# Patient Record
Sex: Male | Born: 1995 | Race: Black or African American | Hispanic: No | Marital: Married | Smoking: Never smoker
Health system: Southern US, Community
[De-identification: ages and names within clinical notes are randomized; demographics above are authoritative.]

---

## 2013-10-24 ENCOUNTER — Encounter (HOSPITAL_COMMUNITY): Payer: Self-pay | Admitting: Emergency Medicine

## 2013-10-24 ENCOUNTER — Emergency Department (HOSPITAL_COMMUNITY)
Admission: EM | Admit: 2013-10-24 | Discharge: 2013-10-24 | Disposition: A | Discharge status: Home / Self Care | Payer: Worker's Compensation | Attending: Emergency Medicine | Admitting: Emergency Medicine

## 2013-10-24 ENCOUNTER — Emergency Department (HOSPITAL_COMMUNITY): Payer: Worker's Compensation

## 2013-10-24 DIAGNOSIS — S61219A Laceration without foreign body of unspecified finger without damage to nail, initial encounter: Secondary | ICD-10-CM

## 2013-10-24 DIAGNOSIS — Y9289 Other specified places as the place of occurrence of the external cause: Secondary | ICD-10-CM | POA: Insufficient documentation

## 2013-10-24 DIAGNOSIS — Y990 Civilian activity done for income or pay: Secondary | ICD-10-CM | POA: Insufficient documentation

## 2013-10-24 DIAGNOSIS — W230XXA Caught, crushed, jammed, or pinched between moving objects, initial encounter: Secondary | ICD-10-CM | POA: Insufficient documentation

## 2013-10-24 DIAGNOSIS — Y9389 Activity, other specified: Secondary | ICD-10-CM | POA: Insufficient documentation

## 2013-10-24 DIAGNOSIS — S61209A Unspecified open wound of unspecified finger without damage to nail, initial encounter: Secondary | ICD-10-CM | POA: Insufficient documentation
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
## 2013-10-24 NOTE — ED Notes (Signed)
Pt reports he was working at [employer] and had crush injury to his distal part of middle finger on right hand. Slight bleeding still. Pt able to bend proximal joint. Radial pulse strong. Pain 2/10.

## 2013-10-25 NOTE — ED Provider Notes (Signed)
Medical screening examination/treatment/procedure(s) were performed by non-physician practitioner and as supervising physician I was immediately available for consultation/collaboration.  Shanna Cisco, MD 10/25/13 [phone]

## 2014-03-08 ENCOUNTER — Emergency Department (HOSPITAL_COMMUNITY)
Admission: EM | Admit: 2014-03-08 | Discharge: 2014-03-09 | Disposition: A | Discharge status: Home / Self Care | Payer: Managed Care, Other (non HMO) | Attending: Emergency Medicine | Admitting: Emergency Medicine

## 2014-03-08 ENCOUNTER — Encounter (HOSPITAL_COMMUNITY): Payer: Self-pay | Admitting: Emergency Medicine

## 2014-03-08 DIAGNOSIS — Y9389 Activity, other specified: Secondary | ICD-10-CM | POA: Insufficient documentation

## 2014-03-08 DIAGNOSIS — S060X0A Concussion without loss of consciousness, initial encounter: Secondary | ICD-10-CM | POA: Insufficient documentation

## 2014-03-08 DIAGNOSIS — Y92410 Unspecified street and highway as the place of occurrence of the external cause: Secondary | ICD-10-CM | POA: Insufficient documentation

## 2014-03-08 DIAGNOSIS — IMO0002 Reserved for concepts with insufficient information to code with codable children: Secondary | ICD-10-CM | POA: Insufficient documentation

## 2014-03-08 DIAGNOSIS — S0990XA Unspecified injury of head, initial encounter: Secondary | ICD-10-CM

## 2014-03-08 DIAGNOSIS — S060X9A Concussion with loss of consciousness of unspecified duration, initial encounter: Secondary | ICD-10-CM

## 2014-03-08 DIAGNOSIS — T07XXXA Unspecified multiple injuries, initial encounter: Secondary | ICD-10-CM

## 2014-03-08 DIAGNOSIS — Z79899 Other long term (current) drug therapy: Secondary | ICD-10-CM | POA: Insufficient documentation

## 2014-03-08 DIAGNOSIS — S060XAA Concussion with loss of consciousness status unknown, initial encounter: Secondary | ICD-10-CM
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
## 2014-03-09 MED ORDER — IBUPROFEN 800 MG PO TABS: 800.0000 mg | ORAL_TABLET | Freq: Three times a day (TID) | ORAL | Status: DC

## 2014-03-09 MED ORDER — METHOCARBAMOL 500 MG PO TABS: 500.0000 mg | ORAL_TABLET | Freq: Two times a day (BID) | ORAL | Status: DC

## 2014-03-09 NOTE — Discharge Instructions (Signed)
Concussion, Adult °A concussion, or closed-head injury, is a brain injury caused by a direct blow to the head or by a quick and sudden movement (jolt) of the head or neck. Concussions are usually not life-threatening. Even so, the effects of a concussion can be serious. If you have had a concussion before, you are more likely to experience concussion-like symptoms after a direct blow to the head.  °CAUSES  °· Direct blow to the head, such as from running into another player during a soccer game, being hit in a fight, or hitting your head on a hard surface. °· A jolt of the head or neck that causes the brain to move back and forth inside the skull, such as in a car crash. °SIGNS AND SYMPTOMS  °The signs of a concussion can be hard to notice. Early on, they may be missed by you, family members, and health care providers. You may look fine but act or feel differently. °Symptoms are usually temporary, but they may last for days, weeks, or even longer. Some symptoms may appear right away while others may not show up for hours or days. Every head injury is different. Symptoms include:  °· Mild to moderate headaches that will not go away. °· A feeling of pressure inside your head.  °· Having more trouble than usual:   °· Learning or remembering things you have heard. °· Answering questions.  °· Paying attention or concentrating.   °· Organizing daily tasks.   °· Making decisions and solving problems.   °· Slowness in thinking, acting or reacting, speaking, or reading.   °· Getting lost or being easily confused.   °· Feeling tired all the time or lacking energy (fatigued).   °· Feeling drowsy.   °· Sleep disturbances.   °· Sleeping more than usual.   °· Sleeping less than usual.   °· Trouble falling asleep.   °· Trouble sleeping (insomnia).   °· Loss of balance or feeling lightheaded or dizzy.   °· Nausea or vomiting.   °· Numbness or tingling.   °· Increased sensitivity to:   °· Sounds.   °· Lights.   °· Distractions.    °· Vision problems or eyes that tire easily.   °· Diminished sense of taste or smell.   °· Ringing in the ears.   °· Mood changes such as feeling sad or anxious.   °· Becoming easily irritated or angry for little or no reason.   °· Lack of motivation. °· Seeing or hearing things other people do not see or hear (hallucinations). °DIAGNOSIS  °Your health care provider can usually diagnose a concussion based on a description of your injury and symptoms. He or she will ask whether you passed out (lost consciousness) and whether you are having trouble remembering events that happened right before and during your injury.  °Your evaluation might include:  °· A brain scan to look for signs of injury to the brain. Even if the test shows no injury, you may still have a concussion.   °· Blood tests to be sure other problems are not present. °TREATMENT  °· Concussions are usually treated in an emergency department, in urgent care, or at a clinic. You may need to stay in the hospital overnight for further treatment.   °· Tell your health care provider if you are taking any medicines, including prescription medicines, over-the-counter medicines, and natural remedies. Some medicines, such as blood thinners (anticoagulants) and aspirin, may increase the chance of complications. Also tell your health care provider whether you have had alcohol or are taking illegal drugs. This information may affect treatment. °· Your health care provider will send you   home with important instructions to follow. °· How fast you will recover from a concussion depends on many factors. These factors include how severe your concussion is, what part of your brain was injured, your age, and how healthy you were before the concussion. °· Most people with mild injuries recover fully. Recovery can take time. In general, recovery is slower in older persons. Also, persons who have had a concussion in the past or have other medical problems may find that it  takes longer to recover from their current injury. °HOME CARE INSTRUCTIONS  °General Instructions °· Carefully follow the directions your health care provider gave you. °· Only take over-the-counter or prescription medicines for pain, discomfort, or fever as directed by your health care provider. °· Take only those medicines that your health care provider has approved. °· Do not drink alcohol until your health care provider says you are well enough to do so. Alcohol and certain other drugs may slow your recovery and can put you at risk of further injury. °· If it is harder than usual to remember things, write them down. °· If you are easily distracted, try to do one thing at a time. For example, do not try to watch TV while fixing dinner. °· Talk with family members or close friends when making important decisions. °· Keep all follow-up appointments. Repeated evaluation of your symptoms is recommended for your recovery. °· Watch your symptoms and tell others to do the same. Complications sometimes occur after a concussion. Older adults with a brain injury may have a higher risk of serious complications such as of a blood clot on the brain. °· Tell your teachers, school nurse, school counselor, coach, athletic trainer, or work manager about your injury, symptoms, and restrictions. Tell them about what you can or cannot do. They should watch for:   °· Increased problems with attention or concentration.   °· Increased difficulty remembering or learning new information.   °· Increased time needed to complete tasks or assignments.   °· Increased irritability or decreased ability to cope with stress.   °· Increased symptoms.   °· Rest. Rest helps the brain to heal. Make sure you: °· Get plenty of sleep at night. Avoid staying up late at night. °· Keep the same bedtime hours on weekends and weekdays. °· Rest during the day. Take daytime naps or rest breaks when you feel tired. °· Limit activities that require a lot of  thought or concentration. These includes   °· Doing homework or job-related work.   °· Watching TV.   °· Working on the computer. °· Avoid any situation where there is potential for another head injury (football, hockey, soccer, basketball, martial arts, downhill snow sports and horseback riding). Your condition will get worse every time you experience a concussion. You should avoid these activities until you are evaluated by the appropriate follow-up caregivers. °Returning To Your Regular Activities °You will need to return to your normal activities slowly, not all at once. You must give your body and brain enough time for recovery. °· Do not return to sports or other athletic activities until your health care provider tells you it is safe to do so. °· Ask your health care provider when you can drive, ride a bicycle, or operate heavy machinery. Your ability to react may be slower after a brain injury. Never do these activities if you are dizzy. °· Ask your health care provider about when you can return to work or school. °Preventing Another Concussion °It is very important to avoid another   brain injury, especially before you have recovered. In rare cases, another injury can lead to permanent brain damage, brain swelling, or death. The risk of this is greatest during the first 7 10 days after a head injury. Avoid injuries by:  °· Wearing a seat belt when riding in a car.   °· Drinking alcohol only in moderation.   °· Wearing a helmet when biking, skiing, skateboarding, skating, or doing similar activities. °· Avoiding activities that could lead to a second concussion, such as contact or recreational sports, until your health care provider says it is OK. °· Taking safety measures in your home.   °· Remove clutter and tripping hazards from floors and stairways.   °· Use grab bars in bathrooms and handrails by stairs.   °· Place non-slip mats on floors and in bathtubs.   °· Improve lighting in dim areas. °SEEK MEDICAL  CARE IF:  °· You have increased problems paying attention or concentrating.   °· You have increased difficulty remembering or learning new information.   °· You need more time to complete tasks or assignments than before.   °· You have increased irritability or decreased ability to cope with stress. °· You have more symptoms than before. °Seek medical care if you have any of the following symptoms for more than 2 weeks after your injury:  °· Lasting (chronic) headaches.   °· Dizziness or balance problems.   °· Nausea. °· Vision problems.   °· Increased sensitivity to noise or light.   °· Depression or mood swings.   °· Anxiety or irritability.   °· Memory problems.   °· Difficulty concentrating or paying attention.   °· Sleep problems.   °· Feeling tired all the time. °SEEK IMMEDIATE MEDICAL CARE IF:  °· You have severe or worsening headaches. These may be a sign of a blood clot in the brain. °· You have weakness (even if only in one hand, leg, or part of the face). °· You have numbness. °· You have decreased coordination.   °· You vomit repeatedly.  °· You have increased sleepiness. °· One pupil is larger than the other.   °· You have convulsions.   °· You have slurred speech.   °· You have increased confusion. This may be a sign of a blood clot in the brain. °· You have increased restlessness, agitation, or irritability.   °· You are unable to recognize people or places.   °· You have neck pain.   °· It is difficult to wake you up.   °· You have unusual behavior changes.   °· You lose consciousness. °MAKE SURE YOU:  °· Understand these instructions. °· Will watch your condition. °· Will get help right away if you are not doing well or get worse. °Document Released: 02/15/2004 Document Revised: 07/28/2013 Document Reviewed: 06/17/2013 °[employer]® Patient Information ©2014 [employer]. ° °Contusion °A contusion is a deep bruise. Contusions are the result of an injury that caused bleeding under the skin. The  contusion may turn blue, purple, or yellow. Minor injuries will give you a painless contusion, but more severe contusions may stay painful and swollen for a few weeks.  °CAUSES  °A contusion is usually caused by a blow, trauma, or direct force to an area of the body. °SYMPTOMS  °· Swelling and redness of the injured area. °· Bruising of the injured area. °· Tenderness and soreness of the injured area. °· Pain. °DIAGNOSIS  °The diagnosis can be made by taking a history and physical exam. An X-ray, CT scan, or MRI may be needed to determine if there were any associated injuries, such as fractures. °TREATMENT  °Specific   treatment will depend on what area of the body was injured. In general, the best treatment for a contusion is resting, icing, elevating, and applying cold compresses to the injured area. Over-the-counter medicines may also be recommended for pain control. Ask your caregiver what the best treatment is for your contusion. °HOME CARE INSTRUCTIONS  °· Put ice on the injured area. °· Put ice in a plastic bag. °· Place a towel between your skin and the bag. °· Leave the ice on for 15-20 minutes, 03-04 times a day. °· Only take over-the-counter or prescription medicines for pain, discomfort, or fever as directed by your caregiver. Your caregiver may recommend avoiding anti-inflammatory medicines (aspirin, ibuprofen, and naproxen) for 48 hours because these medicines may increase bruising. °· Rest the injured area. °· If possible, elevate the injured area to reduce swelling. °SEEK IMMEDIATE MEDICAL CARE IF:  °· You have increased bruising or swelling. °· You have pain that is getting worse. °· Your swelling or pain is not relieved with medicines. °MAKE SURE YOU:  °· Understand these instructions. °· Will watch your condition. °· Will get help right away if you are not doing well or get worse. °Document Released: 09/04/2005 Document Revised: 02/17/2012 Document Reviewed: 09/30/2011 °[employer]® Patient Information  ©2014 [employer]. ° °

## 2014-03-09 NOTE — ED Provider Notes (Signed)
Medical screening examination/treatment/procedure(s) were performed by non-physician practitioner and as supervising physician I was immediately available for consultation/collaboration.   EKG Interpretation None       Nil Xiong L Reeves Musick, MD 03/09/14 1839 

## 2016-11-28 ENCOUNTER — Encounter (HOSPITAL_COMMUNITY): Payer: Self-pay | Admitting: Emergency Medicine

## 2016-11-28 ENCOUNTER — Ambulatory Visit (HOSPITAL_COMMUNITY)
Admission: EM | Admit: 2016-11-28 | Discharge: 2016-11-28 | Disposition: A | Discharge status: Home / Self Care | Payer: BC Managed Care – PPO | Attending: Emergency Medicine | Admitting: Emergency Medicine

## 2016-11-28 DIAGNOSIS — R197 Diarrhea, unspecified: Secondary | ICD-10-CM

## 2016-11-28 MED ORDER — METRONIDAZOLE 500 MG PO TABS: 500.0000 mg | ORAL_TABLET | Freq: Two times a day (BID) | ORAL | 0 refills | Status: DC

## 2016-11-28 NOTE — ED Provider Notes (Signed)
CSN: [id number]     Arrival date & time 11/28/16  1732 History   First MD Initiated Contact with Patient 11/28/16 1801     Chief Complaint  Patient presents with  . Diarrhea   (Consider location/radiation/quality/duration/timing/severity/associated sxs/prior Treatment) HPI: Pt c/o cramping abdominal pain with watery diarrhea x 3 days. Frequency increasing gradually. Small mucus with blood tinge watery diarrhea. Denies fever/chills. Reports Sx Started after started Augmentin for Sinusitis.  History reviewed. No pertinent past medical history. History reviewed. No pertinent surgical history. No family history on file. Social History  Substance Use Topics  . Smoking status: Never Smoker  . Smokeless tobacco: Not on file  . Alcohol use No    Review of Systems  Constitutional: Negative.   Gastrointestinal: Positive for abdominal pain, diarrhea and nausea. Negative for rectal pain and vomiting.    Allergies  Patient has no known allergies.  Home Medications   Prior to Admission medications   Medication Sig Start Date End Date Taking? Authorizing Provider  OVER THE COUNTER MEDICATION ?possibly generic augmentin.  Patient /family not certain.   Yes Historical Provider, MD  ibuprofen (ADVIL,MOTRIN) 800 MG tablet Take 1 tablet (800 mg total) by mouth 3 (three) times daily. 03/09/14   Fayrene HelperBowie Tran, PA-C  methocarbamol (ROBAXIN) 500 MG tablet Take 1 tablet (500 mg total) by mouth 2 (two) times daily. 03/09/14   Fayrene HelperBowie Tran, PA-C  Multiple Vitamins-Minerals (MULTIVITAMIN WITH MINERALS) tablet Take 1 tablet by mouth daily.    Historical Provider, MD   Meds Ordered and Administered this Visit  Medications - No data to display  BP 142/83 (BP Location: Left Arm)   Pulse 69   Temp 98.8 F (37.1 C) (Oral)   Resp 20   SpO2 96%  No data found.   Physical Exam  Constitutional: He appears well-nourished.  Abdominal: Soft. Bowel sounds are normal. He exhibits no distension and no mass. There is  no tenderness. There is no rebound and no guarding. No hernia.  Denies abdominal pain, discomfort to palpation reported.  Urgent Care Course   Clinical Course     Procedures (including critical care time)  Labs Review Labs Reviewed  C DIFFICILE QUICK SCREEN W PCR REFLEX    Imaging Review No results found.   Visual Acuity Review  Right Eye Distance:   Left Eye Distance:   Bilateral Distance:    Right Eye Near:   Left Eye Near:    Bilateral Near:         MDM   1. Diarrhea, unspecified type    Suspicion for C. Diff Colitis based on history and clinical presentation and Diarrhea after use of ABX. Stool culture for C Diff collected . Pt empirically Tx with Metronidazole. Pt agrees with plan of care.Stop Augmentin. If Sx Worsens go to Ed immediately or call [id number]    Edgard Debord, NP 11/28/16 1832

## 2016-11-28 NOTE — ED Triage Notes (Addendum)
Abdominal pain and diarrhea for 2 days, no nausea or vomiting.  Patient started generic augmentin on 12/11.  Patient is still taking this since he has missed a few pills

## 2018-08-14 ENCOUNTER — Emergency Department (HOSPITAL_COMMUNITY): Payer: BC Managed Care – PPO

## 2018-08-14 ENCOUNTER — Emergency Department (HOSPITAL_COMMUNITY)
Admission: EM | Admit: 2018-08-14 | Discharge: 2018-08-14 | Disposition: A | Discharge status: Home / Self Care | Payer: BC Managed Care – PPO | Attending: Emergency Medicine | Admitting: Emergency Medicine

## 2018-08-14 ENCOUNTER — Encounter (HOSPITAL_COMMUNITY): Payer: Self-pay | Admitting: Emergency Medicine

## 2018-08-14 DIAGNOSIS — R7401 Elevation of levels of liver transaminase levels: Secondary | ICD-10-CM

## 2018-08-14 DIAGNOSIS — R509 Fever, unspecified: Secondary | ICD-10-CM | POA: Diagnosis present

## 2018-08-14 DIAGNOSIS — R740 Nonspecific elevation of levels of transaminase and lactic acid dehydrogenase [LDH]: Secondary | ICD-10-CM | POA: Diagnosis not present

## 2018-08-14 DIAGNOSIS — R591 Generalized enlarged lymph nodes: Secondary | ICD-10-CM | POA: Diagnosis not present

## 2018-08-14 DIAGNOSIS — B2790 Infectious mononucleosis, unspecified without complication: Secondary | ICD-10-CM | POA: Insufficient documentation

## 2018-08-14 LAB — CBC
HCT: 46.4 % (ref 39.0–52.0)
Hemoglobin: 16 g/dL (ref 13.0–17.0)
MCH: 30.2 pg (ref 26.0–34.0)
MCHC: 34.5 g/dL (ref 30.0–36.0)
MCV: 87.5 fL (ref 78.0–100.0)
Platelets: 100 10*3/uL — ABNORMAL LOW (ref 150–400)
RBC: 5.3 MIL/uL (ref 4.22–5.81)
RDW: 11.8 % (ref 11.5–15.5)
WBC: 3.9 10*3/uL — ABNORMAL LOW (ref 4.0–10.5)

## 2018-08-14 LAB — URINALYSIS, ROUTINE W REFLEX MICROSCOPIC
Bilirubin Urine: NEGATIVE
Glucose, UA: NEGATIVE mg/dL
Hgb urine dipstick: NEGATIVE
KETONES UR: NEGATIVE mg/dL
LEUKOCYTES UA: NEGATIVE
NITRITE: NEGATIVE
Protein, ur: NEGATIVE mg/dL
SPECIFIC GRAVITY, URINE: 1.021 (ref 1.005–1.030)
pH: 6 (ref 5.0–8.0)

## 2018-08-14 LAB — BASIC METABOLIC PANEL
Anion gap: 10 (ref 5–15)
BUN: 6 mg/dL (ref 6–20)
CALCIUM: 9.2 mg/dL (ref 8.9–10.3)
CO2: 26 mmol/L (ref 22–32)
Chloride: 103 mmol/L (ref 98–111)
Creatinine, Ser: 1.06 mg/dL (ref 0.61–1.24)
GFR calc Af Amer: >60 mL/min (ref >60)
GLUCOSE: 89 mg/dL (ref 70–99)
Potassium: 4.1 mmol/L (ref 3.5–5.1)
SODIUM: 139 mmol/L (ref 135–145)

## 2018-08-14 LAB — HEPATIC FUNCTION PANEL
ALK PHOS: 65 U/L (ref 38–126)
ALT: 79 U/L — ABNORMAL HIGH (ref 0–44)
AST: 62 U/L — ABNORMAL HIGH (ref 15–41)
Albumin: 4.1 g/dL (ref 3.5–5.0)
BILIRUBIN TOTAL: 1.1 mg/dL (ref 0.3–1.2)
Bilirubin, Direct: 0.4 mg/dL — ABNORMAL HIGH (ref 0.0–0.2)
Indirect Bilirubin: 0.7 mg/dL (ref 0.3–0.9)
Total Protein: 6.9 g/dL (ref 6.5–8.1)

## 2018-08-14 LAB — MONONUCLEOSIS SCREEN: Mono Screen: POSITIVE — AB

## 2018-08-14 MED ORDER — ACETAMINOPHEN 500 MG PO TABS
1000.0000 mg | ORAL_TABLET | Freq: Once | ORAL | Status: AC
  Administered 2018-08-14: 1000 mg via ORAL
  Filled 2018-08-14: qty 2

## 2018-08-14 NOTE — ED Triage Notes (Signed)
Pt presents with multiple complaints. Pt states for the past 3-4 days he has had fever, HA, gen body aches. Pt was seen at [facility] and tested for strep which came back negative.

## 2018-08-14 NOTE — ED Notes (Signed)
Per patient he came in because he felt sore all over his body, both hands were cold and tingling around 2300 last night. Home temp read 100.2. Denies chest pains, N/V. A/Ox4

## 2018-08-14 NOTE — ED Provider Notes (Signed)
[facility] EMERGENCY DEPARTMENT Provider Note  CSN: [id number] Arrival date & time: 08/14/18 0011  Chief Complaint(s) Fever  HPI Jonathan Giles is a 22 y.o. male who presents to the emergency department with 4 to 5 days of intermittent fevers, headache and myalgias.  Has tried taking over-the-counter medicine with minimal relief.  He denies any known sick contacts.  He denies any otalgia, URI symptoms, cough, nausea, vomiting, diarrhea, abdominal pain, urinary symptoms.  He denies any sore throat.  States that he was seen at urgent care several days ago and had a negative strep.  Patient did endorse being tested for HIV, RPR, GC/chlamydia in August after he was in contact with the person which he said had herpes.   HPI  Past Medical History History reviewed. No pertinent past medical history. There are no active problems to display for this patient.  Home Medication(s) Prior to Admission medications   Medication Sig Start Date End Date Taking? Authorizing Provider  ibuprofen (ADVIL,MOTRIN) 800 MG tablet Take 1 tablet (800 mg total) by mouth 3 (three) times daily. Patient not taking: Reported on 08/14/2018 03/09/14   Fayrene Helper, PA-C  methocarbamol (ROBAXIN) 500 MG tablet Take 1 tablet (500 mg total) by mouth 2 (two) times daily. Patient not taking: Reported on 08/14/2018 03/09/14   Fayrene Helper, PA-C  metroNIDAZOLE (FLAGYL) 500 MG tablet Take 1 tablet (500 mg total) by mouth 2 (two) times daily. Patient not taking: Reported on 08/14/2018 11/28/16   Reinaldo Raddle, NP                                                                                                                                    Past Surgical History History reviewed. No pertinent surgical history. Family History No family history on file.  Social History Social History   Tobacco Use  . Smoking status: Never Smoker  . Smokeless tobacco: Never Used  Substance Use Topics  . Alcohol use: No  . Drug  use: No   Allergies Patient has no known allergies.  Review of Systems Review of Systems All other systems are reviewed and are negative for acute change except as noted in the HPI   Physical Exam Vital Signs  I have reviewed the triage vital signs BP 123/75   Pulse 79   Temp 99.4 F (37.4 C) (Oral)   Resp 20   Ht 5\' 11"  (1.803 m)   Wt 99.8 kg   SpO2 93%   BMI 30.68 kg/m   Physical Exam  Constitutional: He is oriented to person, place, and time. He appears well-developed and well-nourished. No distress.  HENT:  Head: Normocephalic and atraumatic.  Right Ear: External ear normal. Tympanic membrane is not injected, not perforated and not erythematous. No middle ear effusion.  Left Ear: External ear normal. Tympanic membrane is injected. Tympanic membrane is not perforated and not erythematous. A middle ear effusion is present.  Nose: Nose normal.  Mouth/Throat: Mucous membranes are normal. No trismus in the jaw. No oropharyngeal exudate or posterior oropharyngeal erythema. No tonsillar exudate.  Eyes: Conjunctivae and EOM are normal. No scleral icterus.  Neck: Normal range of motion and phonation normal.  Cardiovascular: Normal rate and regular rhythm.  Pulmonary/Chest: Effort normal. No stridor. No respiratory distress.  Abdominal: He exhibits no distension.  Musculoskeletal: Normal range of motion. He exhibits no edema.  Lymphadenopathy:    He has cervical adenopathy.    He has no axillary adenopathy.  Neurological: He is alert and oriented to person, place, and time.  Skin: He is not diaphoretic.  Psychiatric: He has a normal mood and affect. His behavior is normal.  Vitals reviewed.   ED Results and Treatments Labs (all labs ordered are listed, but only abnormal results are displayed) Labs Reviewed  CBC - Abnormal; Notable for the following components:      Result Value   WBC 3.9 (*)    Platelets 100 (*)    All other components within normal limits    MONONUCLEOSIS SCREEN - Abnormal; Notable for the following components:   Mono Screen POSITIVE (*)    All other components within normal limits  HEPATIC FUNCTION PANEL - Abnormal; Notable for the following components:   AST 62 (*)    ALT 79 (*)    Bilirubin, Direct 0.4 (*)    All other components within normal limits  BASIC METABOLIC PANEL  URINALYSIS, ROUTINE W REFLEX MICROSCOPIC                                                                                                                         EKG  EKG Interpretation  Date/Time:    Ventricular Rate:    PR Interval:    QRS Duration:   QT Interval:    QTC Calculation:   R Axis:     Text Interpretation:        Radiology Dg Chest 2 View  Result Date: 08/14/2018 CLINICAL DATA:  Multiple complaints. Four day history of fever, headaches, general body aches. Negative strep test at urgent care center. EXAM: CHEST - 2 VIEW COMPARISON:  None. FINDINGS: The heart size and mediastinal contours are within normal limits. Both lungs are clear. The visualized skeletal structures are unremarkable. IMPRESSION: No active cardiopulmonary disease. Electronically Signed   By: Burman Nieves M.D.   On: 08/14/2018 00:58   Pertinent labs & imaging results that were available during my care of the patient were reviewed by me and considered in my medical decision making (see chart for details).  Medications Ordered in ED Medications  acetaminophen (TYLENOL) tablet 1,000 mg (1,000 mg Oral Given 08/14/18 0027)  Procedures Procedures  (including critical care time)  Medical Decision Making / ED Course I have reviewed the nursing notes for this encounter and the patient's prior records (if available in EHR or on provided paperwork).    Patient presents with 4 to 5 days of intermittent fever.  Noted to have  lymphadenopathy and left ear effusion which is not purulent.  Screening labs obtain revealed a positive Monospot and mild transaminitis suspicious for mononucleosis.  Given possible STD exposure, retesting for HIV was offered but patient declined.  He states that he needs to have a checkup by his primary care provider and will request testing at that time.  The patient appears reasonably screened and/or stabilized for discharge and I doubt any other medical condition or other [facility] requiring further screening, evaluation, or treatment in the ED at this time prior to discharge.  The patient is safe for discharge with strict return precautions.   Final Clinical Impression(s) / ED Diagnoses Final diagnoses:  Monospot test positive  Febrile illness  Lymphadenopathy  Transaminitis   Disposition: Discharge  Condition: Good  I have discussed the results, Dx and Tx plan with the patient who expressed understanding and agree(s) with the plan. Discharge instructions discussed at great length. The patient was given strict return precautions who verbalized understanding of the instructions. No further questions at time of discharge.    ED Discharge Orders    None       Follow Up: Dorothyann Peng, MD [address] [place] [address] [phone]   in 1-2 weeks, If symptoms do not improve or  worsen      This chart was dictated using voice recognition software.  Despite best efforts to proofread,  errors can occur which can change the documentation meaning.   Nira Conn, MD 08/14/18 [phone]

## 2018-09-06 ENCOUNTER — Emergency Department (HOSPITAL_COMMUNITY)
Admission: EM | Admit: 2018-09-06 | Discharge: 2018-09-07 | Disposition: A | Discharge status: Home / Self Care | Payer: BC Managed Care – PPO | Attending: Emergency Medicine | Admitting: Emergency Medicine

## 2018-09-06 ENCOUNTER — Other Ambulatory Visit: Payer: Self-pay

## 2018-09-06 ENCOUNTER — Encounter (HOSPITAL_COMMUNITY): Payer: Self-pay

## 2018-09-06 DIAGNOSIS — R600 Localized edema: Secondary | ICD-10-CM | POA: Diagnosis not present

## 2018-09-06 DIAGNOSIS — T7840XA Allergy, unspecified, initial encounter: Secondary | ICD-10-CM

## 2018-09-06 DIAGNOSIS — R21 Rash and other nonspecific skin eruption: Secondary | ICD-10-CM | POA: Diagnosis present

## 2018-09-06 NOTE — ED Triage Notes (Addendum)
Pt her with rash and facial swelling that began yesterday.  Recently taking 10 run of PO amoxicillin. A&Ox4. No problems breathing, voice clear, lungs clear.

## 2018-09-07 MED ORDER — DEXAMETHASONE SODIUM PHOSPHATE 10 MG/ML IJ SOLN
10.0000 mg | Freq: Once | INTRAMUSCULAR | Status: AC
  Administered 2018-09-07: 10 mg via INTRAMUSCULAR
  Filled 2018-09-07: qty 1

## 2018-09-07 MED ORDER — DIPHENHYDRAMINE HCL 25 MG PO CAPS
25.0000 mg | ORAL_CAPSULE | Freq: Once | ORAL | Status: AC
  Administered 2018-09-07: 25 mg via ORAL
  Filled 2018-09-07: qty 1

## 2018-09-07 NOTE — ED Provider Notes (Signed)
[facility] EMERGENCY DEPARTMENT Provider Note   CSN: [id number] Arrival date & time: 09/06/18  2315     History   Chief Complaint Chief Complaint  Patient presents with  . Rash  . Facial Swelling    HPI Jonathan Giles is a 22 y.o. male.  The history is provided by the patient and medical records.  Rash       22 year old male presenting to the ED with rash and sensation of facial swelling.  Reports he has been on amoxicillin for the past 10 days for strep throat, finished this yesterday.  States yesterday he started noticing small bumps across his face, predominantly on the forehead, and sensation that his face is swelling.  He denies any difficulty swallowing or shortness of breath.  No sensation of throat closing.  He has no known allergies, unsure if he has taken amoxicillin in the past.  He denies any new foods or changes in diet.  No new soaps or detergents.  He did take some Benadryl yesterday at 10 AM, none since then.  History reviewed. No pertinent past medical history.  There are no active problems to display for this patient.   History reviewed. No pertinent surgical history.      Home Medications    Prior to Admission medications   Medication Sig Start Date End Date Taking? Authorizing Provider  ibuprofen (ADVIL,MOTRIN) 800 MG tablet Take 1 tablet (800 mg total) by mouth 3 (three) times daily. Patient not taking: Reported on 08/14/2018 03/09/14   Fayrene Helper, PA-C  methocarbamol (ROBAXIN) 500 MG tablet Take 1 tablet (500 mg total) by mouth 2 (two) times daily. Patient not taking: Reported on 08/14/2018 03/09/14   Fayrene Helper, PA-C  metroNIDAZOLE (FLAGYL) 500 MG tablet Take 1 tablet (500 mg total) by mouth 2 (two) times daily. Patient not taking: Reported on 08/14/2018 11/28/16   Reinaldo Raddle, NP    Family History History reviewed. No pertinent family history.  Social History Social History   Tobacco Use  . Smoking status: Never Smoker    . Smokeless tobacco: Never Used  Substance Use Topics  . Alcohol use: No  . Drug use: No     Allergies   Patient has no known allergies.   Review of Systems Review of Systems  Skin: Positive for rash.  All other systems reviewed and are negative.    Physical Exam Updated Vital Signs BP 131/77 (BP Location: Right Arm)   Pulse 73   Temp 98.1 F (36.7 C) (Oral)   Resp 14   SpO2 99%   Physical Exam  Constitutional: He is oriented to person, place, and time. He appears well-developed and well-nourished.  HENT:  Head: Normocephalic and atraumatic.  Mouth/Throat: Oropharynx is clear and moist.  No visible facial or neck swelling, normal phonation without stridor, handling secretions well, tonsils are normal in appearance bilaterally  Eyes: Pupils are equal, round, and reactive to light. Conjunctivae and EOM are normal.  Neck: Normal range of motion.  Cardiovascular: Normal rate, regular rhythm and normal heart sounds.  Pulmonary/Chest: Effort normal and breath sounds normal.  Abdominal: Soft. Bowel sounds are normal.  Musculoskeletal: Normal range of motion.  Neurological: He is alert and oriented to person, place, and time.  Skin: Skin is warm and dry. Rash noted.  Very fine, maculopapular rash on the face, predominantly on the forehead, no urticaria or areas of swelling visualized, no lesions on the palms or soles  Psychiatric: He has a normal  mood and affect.  Nursing note and vitals reviewed.    ED Treatments / Results  Labs (all labs ordered are listed, but only abnormal results are displayed) Labs Reviewed - No data to display  EKG None  Radiology No results found.  Procedures Procedures (including critical care time)  Medications Ordered in ED Medications  dexamethasone (DECADRON) injection 10 mg (10 mg Intramuscular Given 09/07/18 0215)  diphenhydrAMINE (BENADRYL) capsule 25 mg (25 mg Oral Given 09/07/18 0215)     Initial Impression / Assessment  and Plan / ED Course  I have reviewed the triage vital signs and the nursing notes.  Pertinent labs & imaging results that were available during my care of the patient were reviewed by me and considered in my medical decision making (see chart for details).  22 year old male presenting to the ED with rash and sensation of facial swelling.  Finished course of amoxicillin yesterday for strep throat.  He is afebrile and nontoxic.  He does have a very fine maculopapular rash on the face, predominantly over the forehead.  I do not appreciate any facial or neck swelling on exam.  He is handling secretions well, normal phonation without stridor.  Lungs are clear bilaterally.  Vitals are stable.  Tonsils are normal in appearance bilaterally.  Denies any new foods or other medications.  No new soaps or detergents.  Possible reaction to amoxicillin, unsure if he is taking this in the past.  Treated here with Benadryl and Decadron.  Can continue Benadryl at home.  Close follow-up with primary care doctor.  Return here for any new or worsening symptoms.  Final Clinical Impressions(s) / ED Diagnoses   Final diagnoses:  Allergic reaction, initial encounter    ED Discharge Orders    None       Garlon Hatchet, PA-C 09/07/18 0240    Devoria Albe, MD 09/07/18 [phone]

## 2018-09-07 NOTE — Discharge Instructions (Signed)
Continue benadryl every 6-8 hours as needed. Follow-up with your primary care doctor. Return here for new concerns.

## 2019-08-26 ENCOUNTER — Other Ambulatory Visit: Payer: Self-pay

## 2019-08-26 ENCOUNTER — Ambulatory Visit: Payer: BC Managed Care – PPO | Admitting: Nurse Practitioner

## 2019-08-26 ENCOUNTER — Other Ambulatory Visit (HOSPITAL_COMMUNITY)
Admission: RE | Admit: 2019-08-26 | Discharge: 2019-08-26 | Disposition: A | Discharge status: Home / Self Care | Payer: BC Managed Care – PPO | Source: Ambulatory Visit | Attending: Nurse Practitioner | Admitting: Nurse Practitioner

## 2019-08-26 ENCOUNTER — Other Ambulatory Visit: Payer: Self-pay | Admitting: Nurse Practitioner

## 2019-08-26 ENCOUNTER — Encounter: Payer: Self-pay | Admitting: Nurse Practitioner

## 2019-08-26 VITALS — BP 110/70 | HR 75 | Temp 98.1°F | Ht 70.6 in | Wt 222.0 lb

## 2019-08-26 DIAGNOSIS — Z113 Encounter for screening for infections with a predominantly sexual mode of transmission: Secondary | ICD-10-CM

## 2019-08-26 DIAGNOSIS — R21 Rash and other nonspecific skin eruption: Secondary | ICD-10-CM | POA: Diagnosis not present

## 2019-08-26 DIAGNOSIS — Z0000 Encounter for general adult medical examination without abnormal findings: Secondary | ICD-10-CM

## 2019-08-26 DIAGNOSIS — Z8619 Personal history of other infectious and parasitic diseases: Secondary | ICD-10-CM

## 2019-08-26 LAB — POCT URINALYSIS DIPSTICK
Bilirubin, UA: NEGATIVE
Blood, UA: NEGATIVE
Glucose, UA: NEGATIVE
Ketones, UA: NEGATIVE
Leukocytes, UA: NEGATIVE
Nitrite, UA: NEGATIVE
Protein, UA: NEGATIVE
Spec Grav, UA: 1.025 (ref 1.010–1.025)
Urobilinogen, UA: 1 E.U./dL
pH, UA: 7 (ref 5.0–8.0)

## 2019-08-26 NOTE — Progress Notes (Signed)
Subjective:     Patient ID: Jonathan Giles , male    DOB: [birth year] , 23 y.o.   MRN: [id number]   Chief Complaint  Patient presents with  . Annual Exam    HPI  Here for HM - Graduated in May with a Music degree from [employer].  No changes in health in the last 3 years.  He did have mono last year.    Grandfather died of Lung cancer with mets in August 2019. Brother with hypertension   Men's preventive visit. Patient Health Questionnaire (PHQ-2) is    Office Visit from 08/26/2019 in [facility]  PHQ-2 Total Score  0     Patient is on a regular diet trying to avoid Pork. Marital status: Single. Relevant history for alcohol use is:   Social History   Substance and Sexual Activity  Alcohol Use Yes   Comment: occassional  Occasional exercise.    Relevant history for tobacco use is:    Social History   Tobacco Use  Smoking Status Never Smoker  Smokeless Tobacco Never Used  . History reviewed. No pertinent past medical history.   Family History  Problem Relation Age of Onset  . Diabetes Mother   . Cancer Paternal Grandfather     No current outpatient medications on file.   No Known Allergies   Review of Systems  Constitutional: Negative.   HENT: Negative.   Eyes: Negative.   Respiratory: Negative.   Cardiovascular: Negative.   Gastrointestinal: Negative for rectal pain.  Endocrine: Negative.   Genitourinary: Negative.   Musculoskeletal: Negative.   Skin: Negative.   Allergic/Immunologic: Negative.   Neurological: Negative.   Hematological: Negative.   Psychiatric/Behavioral: Negative.      Today's Vitals   08/26/19 1457  BP: 110/70  Pulse: 75  Temp: 98.1 F (36.7 C)  TempSrc: Oral  Weight: 222 lb (100.7 kg)  Height: 5' 10.6" (1.793 m)  PainSc: 0-No pain   Body mass index is 31.31 kg/m.   Objective:  Physical Exam Vitals signs reviewed.  Constitutional:      Appearance: Normal appearance. He is obese.  HENT:     Head:  Normocephalic and atraumatic.     Right Ear: Tympanic membrane, ear canal and external ear normal. There is no impacted cerumen.     Left Ear: Tympanic membrane, ear canal and external ear normal. There is no impacted cerumen.  Neck:     Musculoskeletal: Normal range of motion and neck supple.  Cardiovascular:     Rate and Rhythm: Normal rate and regular rhythm.     Pulses: Normal pulses.     Heart sounds: Normal heart sounds. No murmur.  Pulmonary:     Effort: Pulmonary effort is normal. No respiratory distress.     Breath sounds: Normal breath sounds.  Abdominal:     General: Abdomen is flat. Bowel sounds are normal. There is no distension.     Palpations: Abdomen is soft.  Genitourinary:    Prostate: Normal.     Rectum: Guaiac result negative.  Musculoskeletal: Normal range of motion.  Skin:    General: Skin is warm.     Capillary Refill: Capillary refill takes less than 2 seconds.     Findings: Rash (left lateral side of penis with slightly raised rash, flesh colored) present.  Neurological:     General: No focal deficit present.     Mental Status: He is alert and oriented to person, place, and time.  Psychiatric:        Mood and Affect: Mood normal.        Behavior: Behavior normal.        Thought Content: Thought content normal.        Judgment: Judgment normal.         Assessment And Plan:     1. Encounter for general adult medical examination w/o abnormal findings . Behavior modifications discussed and diet history reviewed.   . Pt will continue to exercise regularly and modify diet with low GI, plant based foods and decrease intake of processed foods.  . Recommend intake of daily multivitamin, Vitamin D, and calcium.  . Recommend for preventive screenings, as well as recommend immunizations that include influenza, TDAP, he is to get his copy of immunization records so I can see if he has had HPV vaccine - POCT Urinalysis Dipstick (81002) - CMP14 + Anion Gap - CBC  no Diff - Lipid Profile  2. Encounter for screening examination for sexually transmitted disease  - T pallidum Screening Cascade - Urine cytology ancillary only - HIV antibody (with reflex)  3. History of mononucleosis   Had mono last year doing well  4. Rash and nonspecific skin eruption  Has flesh colored lifted skin/skin tags to penis area  This may just be a skin tag however I have advised him to get a copy of shot records to check for HPV.       Arnette FeltsJanece Dietrich Samuelson, FNP    THE PATIENT IS ENCOURAGED TO PRACTICE SOCIAL DISTANCING DUE TO THE COVID-19 PANDEMIC.

## 2019-08-26 NOTE — Patient Instructions (Signed)
Health Maintenance  Topic Date Due  . HIV Screening  08/07/2009  . TETANUS/TDAP  08/07/2013  . INFLUENZA VACCINE  03/08/2020 (Originally 07/10/2019)   Health Maintenance, Male Adopting a healthy lifestyle and getting preventive care are important in promoting health and wellness. Ask your health care provider about:  The right schedule for you to have regular tests and exams.  Things you can do on your own to prevent diseases and keep yourself healthy. What should I know about diet, weight, and exercise? Eat a healthy diet   Eat a diet that includes plenty of vegetables, fruits, low-fat dairy products, and lean protein.  Do not eat a lot of foods that are high in solid fats, added sugars, or sodium. Maintain a healthy weight Body mass index (BMI) is a measurement that can be used to identify possible weight problems. It estimates body fat based on height and weight. Your health care provider can help determine your BMI and help you achieve or maintain a healthy weight. Get regular exercise Get regular exercise. This is one of the most important things you can do for your health. Most adults should:  Exercise for at least 150 minutes each week. The exercise should increase your heart rate and make you sweat (moderate-intensity exercise).  Do strengthening exercises at least twice a week. This is in addition to the moderate-intensity exercise.  Spend less time sitting. Even light physical activity can be beneficial. Watch cholesterol and blood lipids Have your blood tested for lipids and cholesterol at 25 years of age, then have this test every 5 years. You may need to have your cholesterol levels checked more often if:  Your lipid or cholesterol levels are high.  You are older than 25 years of age.  You are at high risk for heart disease. What should I know about cancer screening? Many types of cancers can be detected early and may often be prevented. Depending on your health  history and family history, you may need to have cancer screening at various ages. This may include screening for:  Colorectal cancer.  Prostate cancer.  Skin cancer.  Lung cancer. What should I know about heart disease, diabetes, and high blood pressure? Blood pressure and heart disease  High blood pressure causes heart disease and increases the risk of stroke. This is more likely to develop in people who have high blood pressure readings, are of African descent, or are overweight.  Talk with your health care provider about your target blood pressure readings.  Have your blood pressure checked: ? Every 3-5 years if you are 45-21 years of age. ? Every year if you are 38 years old or older.  If you are between the ages of 64 and 75 and are a current or former smoker, ask your health care provider if you should have a one-time screening for abdominal aortic aneurysm (AAA). Diabetes Have regular diabetes screenings. This checks your fasting blood sugar level. Have the screening done:  Once every three years after age 25 if you are at a normal weight and have a low risk for diabetes.  More often and at a younger age if you are overweight or have a high risk for diabetes. What should I know about preventing infection? Hepatitis B If you have a higher risk for hepatitis B, you should be screened for this virus. Talk with your health care provider to find out if you are at risk for hepatitis B infection. Hepatitis C Blood testing is recommended for:  Everyone born from [birth year] through 1967.  Anyone with known risk factors for hepatitis C. Sexually transmitted infections (STIs)  You should be screened each year for STIs, including gonorrhea and chlamydia, if: ? You are sexually active and are younger than 25 years of age. ? You are older than 25 years of age and your health care provider tells you that you are at risk for this type of infection. ? Your sexual activity has changed since  you were last screened, and you are at increased risk for chlamydia or gonorrhea. Ask your health care provider if you are at risk.  Ask your health care provider about whether you are at high risk for HIV. Your health care provider may recommend a prescription medicine to help prevent HIV infection. If you choose to take medicine to prevent HIV, you should first get tested for HIV. You should then be tested every 3 months for as long as you are taking the medicine. Follow these instructions at home: Lifestyle  Do not use any products that contain nicotine or tobacco, such as cigarettes, e-cigarettes, and chewing tobacco. If you need help quitting, ask your health care provider.  Do not use street drugs.  Do not share needles.  Ask your health care provider for help if you need support or information about quitting drugs. Alcohol use  Do not drink alcohol if your health care provider tells you not to drink.  If you drink alcohol: ? Limit how much you have to 0-2 drinks a day. ? Be aware of how much alcohol is in your drink. In the [place] one drink equals one 12 oz bottle of beer (355 mL), one 5 oz glass of wine (148 mL), or one 1 oz glass of hard liquor (44 mL). General instructions  Schedule regular health, dental, and eye exams.  Stay current with your vaccines.  Tell your health care provider if: ? You often feel depressed. ? You have ever been abused or do not feel safe at home. Summary  Adopting a healthy lifestyle and getting preventive care are important in promoting health and wellness.  Follow your health care provider's instructions about healthy diet, exercising, and getting tested or screened for diseases.  Follow your health care provider's instructions on monitoring your cholesterol and blood pressure. This information is not intended to replace advice given to you by your health care provider. Make sure you discuss any questions you have with your health care  provider. Document Released: 05/23/2008 Document Revised: 11/18/2018 Document Reviewed: 11/18/2018 Elsevier Patient Education  2020 [employer].

## 2019-08-27 LAB — CMP14 + ANION GAP
ALT: 38 IU/L (ref 0–44)
AST: 25 IU/L (ref 0–40)
Albumin/Globulin Ratio: 1.8 (ref 1.2–2.2)
Albumin: 4.7 g/dL (ref 4.1–5.2)
Alkaline Phosphatase: 61 IU/L (ref 39–117)
Anion Gap: 14 mmol/L (ref 10.0–18.0)
BUN/Creatinine Ratio: 10 (ref 9–20)
BUN: 11 mg/dL (ref 6–20)
Bilirubin Total: 2.2 mg/dL — ABNORMAL HIGH (ref 0.0–1.2)
CO2: 25 mmol/L (ref 20–29)
Calcium: 9.9 mg/dL (ref 8.7–10.2)
Chloride: 104 mmol/L (ref 96–106)
Creatinine, Ser: 1.13 mg/dL (ref 0.76–1.27)
GFR calc Af Amer: 104 mL/min/{1.73_m2} (ref >59)
GFR calc non Af Amer: 90 mL/min/{1.73_m2} (ref >59)
Globulin, Total: 2.6 g/dL (ref 1.5–4.5)
Glucose: 77 mg/dL (ref 65–99)
Potassium: 4.1 mmol/L (ref 3.5–5.2)
Sodium: 143 mmol/L (ref 134–144)
Total Protein: 7.3 g/dL (ref 6.0–8.5)

## 2019-08-27 LAB — LIPID PANEL
Chol/HDL Ratio: 2.8 ratio (ref 0.0–5.0)
Cholesterol, Total: 200 mg/dL — ABNORMAL HIGH (ref 100–199)
HDL: 72 mg/dL (ref >39)
LDL Chol Calc (NIH): 115 mg/dL — ABNORMAL HIGH (ref 0–99)
Triglycerides: 73 mg/dL (ref 0–149)
VLDL Cholesterol Cal: 13 mg/dL (ref 5–40)

## 2019-08-27 LAB — CBC
Hematocrit: 46.2 % (ref 37.5–51.0)
Hemoglobin: 16 g/dL (ref 13.0–17.7)
MCH: 30.7 pg (ref 26.6–33.0)
MCHC: 34.6 g/dL (ref 31.5–35.7)
MCV: 89 fL (ref 79–97)
Platelets: 126 10*3/uL — ABNORMAL LOW (ref 150–450)
RBC: 5.22 x10E6/uL (ref 4.14–5.80)
RDW: 12.4 % (ref 11.6–15.4)
WBC: 6.4 10*3/uL (ref 3.4–10.8)

## 2019-08-27 LAB — HIV ANTIBODY (ROUTINE TESTING W REFLEX): HIV Screen 4th Generation wRfx: NONREACTIVE

## 2019-08-27 LAB — T PALLIDUM SCREENING CASCADE: T pallidum Antibodies (TP-PA): NONREACTIVE

## 2019-08-31 LAB — URINE CYTOLOGY ANCILLARY ONLY
Chlamydia: NEGATIVE
Neisseria Gonorrhea: NEGATIVE
Trichomonas: NEGATIVE

## 2020-03-02 ENCOUNTER — Ambulatory Visit
Admission: RE | Admit: 2020-03-02 | Discharge: 2020-03-02 | Disposition: A | Discharge status: Home / Self Care | Payer: No Typology Code available for payment source | Source: Ambulatory Visit | Attending: Nurse Practitioner | Admitting: Nurse Practitioner

## 2020-03-02 ENCOUNTER — Other Ambulatory Visit: Payer: Self-pay

## 2020-03-02 ENCOUNTER — Other Ambulatory Visit: Payer: Self-pay | Admitting: Nurse Practitioner

## 2020-03-02 DIAGNOSIS — Z021 Encounter for pre-employment examination: Secondary | ICD-10-CM

## 2020-08-31 ENCOUNTER — Encounter: Payer: 59 | Admitting: Nurse Practitioner

## 2020-10-14 IMAGING — CR DG CHEST 1V
1 series · 1 of 1 positions shown · non-contrast
Comparison: 08/14/2018

CLINICAL DATA: Pre-employment chest x-ray

EXAM:
CHEST  1 VIEW

[w chest pa]
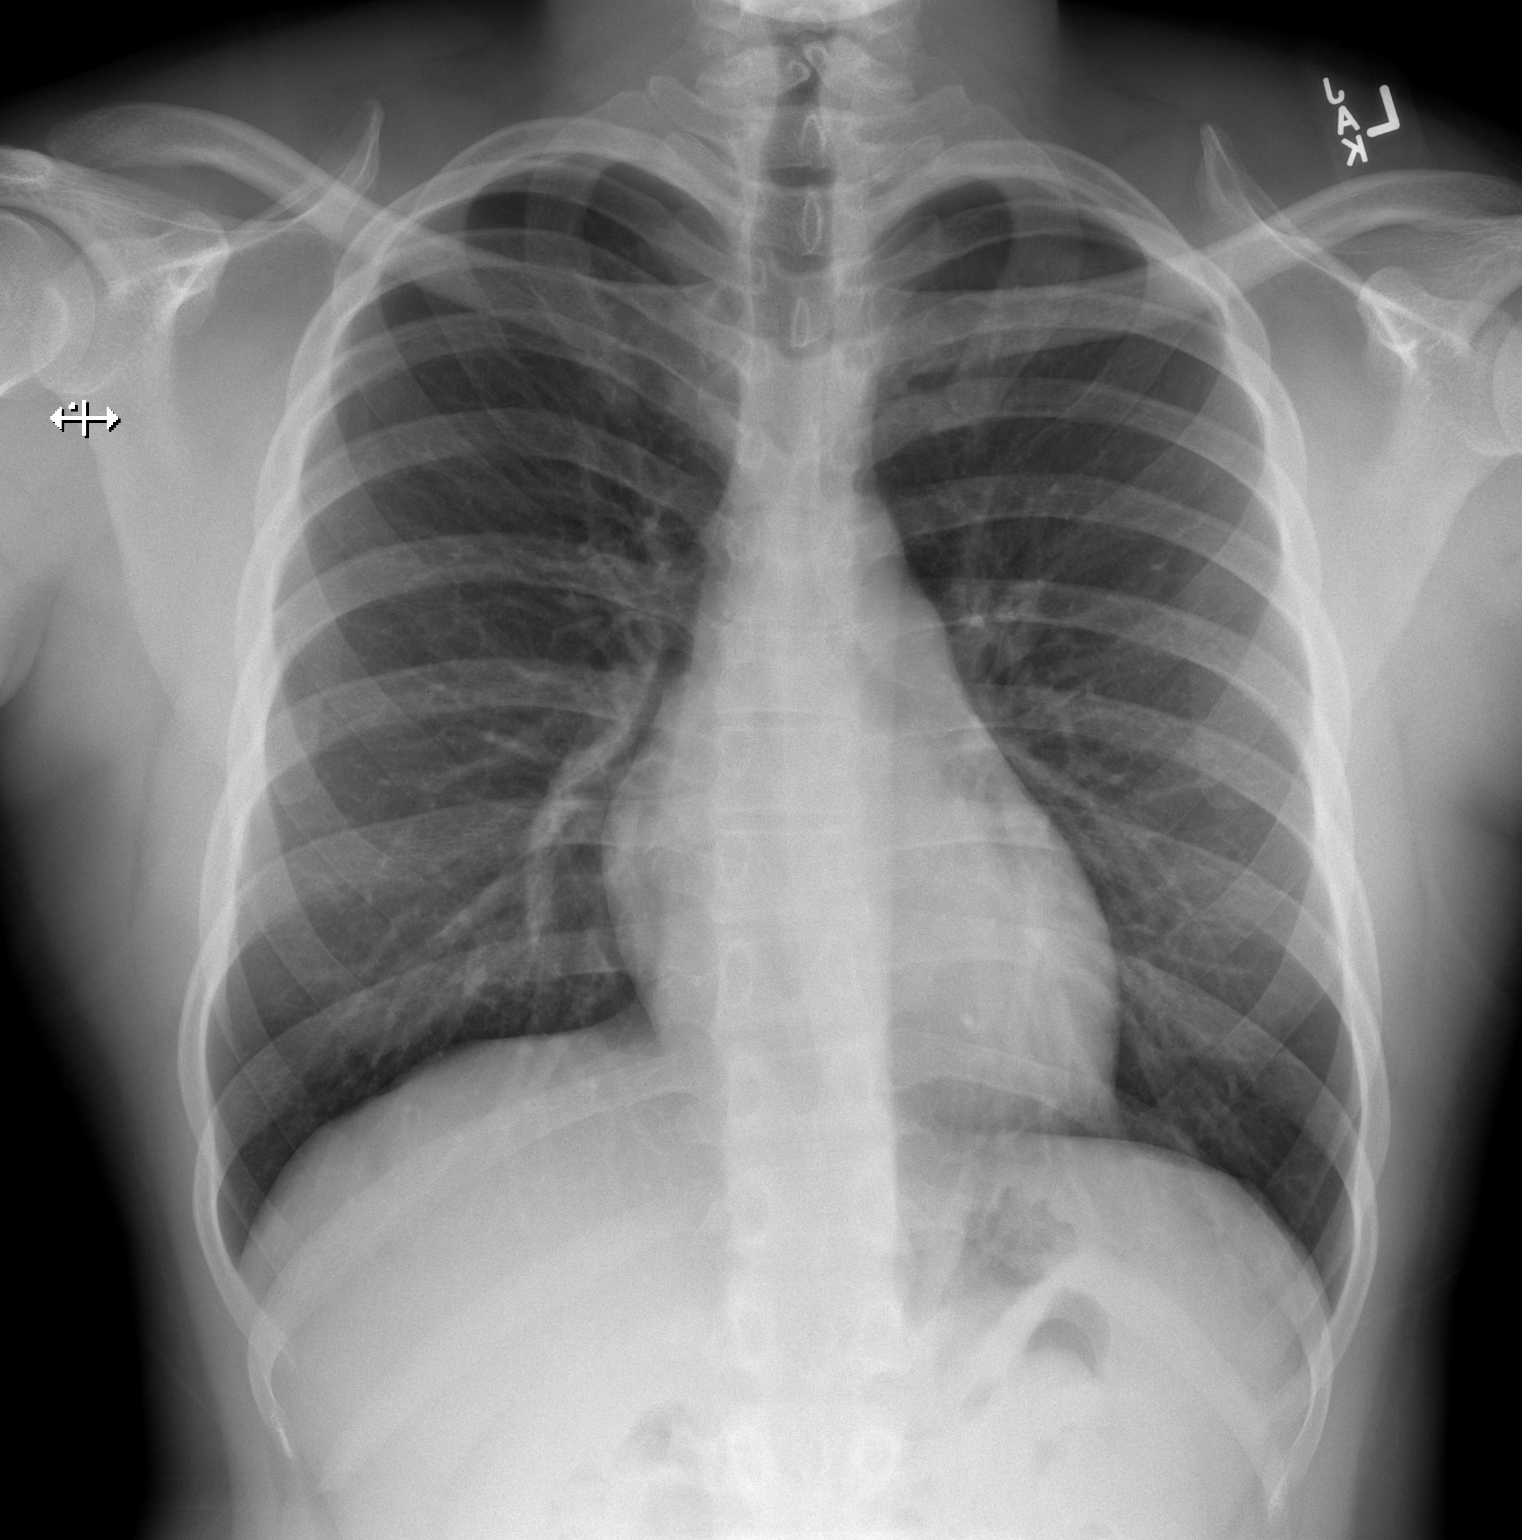

[1 of 1 positions shown; findings below may reference images not displayed]

FINDINGS: The heart size and mediastinal contours are within normal limits.
Both lungs are clear. The visualized skeletal structures are
unremarkable.
IMPRESSION: No active disease.

## 2021-01-21 ENCOUNTER — Ambulatory Visit
Admission: EM | Admit: 2021-01-21 | Discharge: 2021-01-21 | Disposition: A | Discharge status: Home / Self Care | Payer: 59 | Attending: Internal Medicine | Admitting: Internal Medicine

## 2021-01-21 ENCOUNTER — Other Ambulatory Visit: Payer: Self-pay

## 2021-01-21 DIAGNOSIS — R350 Frequency of micturition: Secondary | ICD-10-CM | POA: Diagnosis not present

## 2021-01-21 LAB — POCT URINALYSIS DIP (MANUAL ENTRY)
Bilirubin, UA: NEGATIVE
Blood, UA: NEGATIVE
Glucose, UA: NEGATIVE mg/dL
Ketones, POC UA: NEGATIVE mg/dL
Leukocytes, UA: NEGATIVE
Nitrite, UA: NEGATIVE
Protein Ur, POC: NEGATIVE mg/dL
Spec Grav, UA: 1.01 (ref 1.010–1.025)
Urobilinogen, UA: 0.2 E.U./dL
pH, UA: 6 (ref 5.0–8.0)

## 2021-01-21 NOTE — ED Provider Notes (Signed)
[facility]    CSN: [id number] Arrival date & time: 01/21/21  1140      History   Chief Complaint Chief Complaint  Patient presents with  . Urinary Frequency    X 1 week    HPI Jonathan Giles is a 25 y.o. male.   Here today with 1 week of urinary frequency, mild penile discharge, occasional dysuria, and mild left flank ttp at times. Denies fever, chills, hematuria, N/V, known exposures to STIs. Has been drinking lots of water and cranberry juice which does seem to be helping sxs. No known hx of similar issues.      History reviewed. No pertinent past medical history.  There are no problems to display for this patient.   History reviewed. No pertinent surgical history.     Home Medications    Prior to Admission medications   Not on File    Family History Family History  Problem Relation Age of Onset  . Diabetes Mother   . Cancer Paternal Grandfather     Social History Social History   Tobacco Use  . Smoking status: Never Smoker  . Smokeless tobacco: Never Used  Vaping Use  . Vaping Use: Never used  Substance Use Topics  . Alcohol use: Yes    Comment: occassional  . Drug use: No     Allergies   Patient has no known allergies.   Review of Systems Review of Systems PER HPI   Physical Exam Triage Vital Signs ED Triage Vitals  Enc Vitals Group     BP 01/21/21 1201 118/73     Pulse Rate 01/21/21 1201 68     Resp 01/21/21 1201 20     Temp 01/21/21 1201 98.2 F (36.8 C)     Temp Source 01/21/21 1201 Oral     SpO2 01/21/21 1201 98 %     Weight --      Height --      Head Circumference --      Peak Flow --      Pain Score 01/21/21 1208 2     Pain Loc --      Pain Edu? --      Excl. in [place]? --    No data found.  Updated Vital Signs BP 118/73 (BP Location: Right Arm)   Pulse 68   Temp 98.2 F (36.8 C) (Oral)   Resp 20   SpO2 98%   Visual Acuity Right Eye Distance:   Left Eye Distance:   Bilateral Distance:     Right Eye Near:   Left Eye Near:    Bilateral Near:     Physical Exam Vitals and nursing note reviewed.  Constitutional:      Appearance: Normal appearance.  HENT:     Head: Atraumatic.  Eyes:     Extraocular Movements: Extraocular movements intact.     Conjunctiva/sclera: Conjunctivae normal.  Cardiovascular:     Rate and Rhythm: Normal rate and regular rhythm.  Pulmonary:     Effort: Pulmonary effort is normal.     Breath sounds: Normal breath sounds.  Abdominal:     General: Bowel sounds are normal. There is no distension.     Palpations: Abdomen is soft.     Tenderness: There is abdominal tenderness (minimal LLQ ttp ). There is no right CVA tenderness, left CVA tenderness or guarding.  Genitourinary:    Comments: GU exam deferred, self swab performed Musculoskeletal:        General:  Normal range of motion.     Cervical back: Normal range of motion and neck supple.  Skin:    General: Skin is warm and dry.  Neurological:     General: No focal deficit present.     Mental Status: He is oriented to person, place, and time.  Psychiatric:        Mood and Affect: Mood normal.        Thought Content: Thought content normal.        Judgment: Judgment normal.      UC Treatments / Results  Labs (all labs ordered are listed, but only abnormal results are displayed) Labs Reviewed  POCT URINALYSIS DIP (MANUAL ENTRY)  CYTOLOGY, (ORAL, ANAL, URETHRAL) ANCILLARY ONLY    EKG   Radiology No results found.  Procedures Procedures (including critical care time)  Medications Ordered in UC Medications - No data to display  Initial Impression / Assessment and Plan / UC Course  I have reviewed the triage vital signs and the nursing notes.  Pertinent labs & imaging results that were available during my care of the patient were reviewed by me and considered in my medical decision making (see chart for details).     Exam and vitals reassuring today, U/A completely benign,  cytology swab pending. Will treat based on results. Continue to push fluids, monitor closely for worsening sxs. Return precautions given.  Final Clinical Impressions(s) / UC Diagnoses   Final diagnoses:  Urinary frequency   Discharge Instructions   None    ED Prescriptions    None     PDMP not reviewed this encounter.   Particia Nearing, [place] 01/21/21 1321

## 2021-01-21 NOTE — ED Triage Notes (Signed)
Patient states he had been having urinary frequency x 1 week. Pt states he has pain in his groin area of a 2/10 and has some mild pain while urinating. Pt also states he was disoriented for a day at the beginning but that has resolved about 5 days ago. Pt is aox4 and ambulatory.

## 2021-01-22 ENCOUNTER — Telehealth (HOSPITAL_COMMUNITY): Payer: Self-pay | Admitting: Emergency Medicine

## 2021-01-22 LAB — CYTOLOGY, (ORAL, ANAL, URETHRAL) ANCILLARY ONLY
Chlamydia: POSITIVE — AB
Comment: NEGATIVE
Comment: NEGATIVE
Comment: NORMAL
Neisseria Gonorrhea: NEGATIVE
Trichomonas: NEGATIVE

## 2021-01-22 MED ORDER — DOXYCYCLINE HYCLATE 100 MG PO CAPS: 100.0000 mg | ORAL_CAPSULE | Freq: Two times a day (BID) | ORAL | 0 refills | Status: AC

## 2021-12-04 ENCOUNTER — Telehealth: Payer: 59 | Admitting: Nurse Practitioner

## 2021-12-04 DIAGNOSIS — U071 COVID-19: Secondary | ICD-10-CM | POA: Diagnosis not present

## 2021-12-04 NOTE — Progress Notes (Signed)
Virtual Visit Consent   Jonathan Giles, you are scheduled for a virtual visit with a [facility] provider today.     Just as with appointments in the office, your consent must be obtained to participate.  Your consent will be active for this visit and any virtual visit you may have with one of our providers in the next 365 days.     If you have a MyChart account, a copy of this consent can be sent to you electronically.  All virtual visits are billed to your insurance company just like a traditional visit in the office.    As this is a virtual visit, video technology does not allow for your provider to perform a traditional examination.  This may limit your provider's ability to fully assess your condition.  If your provider identifies any concerns that need to be evaluated in person or the need to arrange testing (such as labs, EKG, etc.), we will make arrangements to do so.     Although advances in technology are sophisticated, we cannot ensure that it will always work on either your end or our end.  If the connection with a video visit is poor, the visit may have to be switched to a telephone visit.  With either a video or telephone visit, we are not always able to ensure that we have a secure connection.     I need to obtain your verbal consent now.   Are you willing to proceed with your visit today?    Jonathan Giles has provided verbal consent on 12/04/2021 for a virtual visit (video or telephone).   Jonathan Simas, FNP   Date: 12/04/2021 5:27 PM   Virtual Visit via Video Note   I, Jonathan Giles, connected with  Jonathan Giles  ([id number], 1996-07-12) on 12/04/21 at  5:30 PM EST by a video-enabled telemedicine application and verified that I am speaking with the correct person using two identifiers.  Location: Patient: Virtual Visit Location Patient: Home Provider: Virtual Visit Location Provider: Home Office   I discussed the limitations of evaluation and management by telemedicine and  the availability of in person appointments. The patient expressed understanding and agreed to proceed.    History of Present Illness: Jonathan Giles is a 25 y.o. who identifies as a male who was assigned male at birth, and is being seen today for follow up on COVID infection instructions.   He tested positive for COVID 3 days ago.  He started to have symptoms on 11/30/21 with mild nasal congestion, fever and PND.  He has been monitoring his symptoms, he has not had a fever for 18 hours now and has not taken any tylenol today.   He is looking for direction about going back to work.   He does not believe he has had COVID in the past  He has been vaccinated x2.     Problems: There are no problems to display for this patient.   Allergies: No Known Allergies Medications: No current outpatient medications on file.  Observations/Objective: Patient is well-developed, well-nourished in no acute distress.  Resting comfortably at home.  Head is normocephalic, atraumatic.  No labored breathing.  Speech is clear and coherent with logical content.  Patient is alert and oriented at baseline.    Assessment and Plan:  1. COVID-19 Continue to follow isolation precautions may return to work tomorrow with a mask for 5 additional days   Schedule a follow up for any new or  worsening symptoms as discussed   Follow Up Instructions: I discussed the assessment and treatment plan with the patient. The patient was provided an opportunity to ask questions and all were answered. The patient agreed with the plan and demonstrated an understanding of the instructions.  A copy of instructions were sent to the patient via MyChart unless otherwise noted below.     The patient was advised to call back or seek an in-person evaluation if the symptoms worsen or if the condition fails to improve as anticipated.  Time:  I spent 10 minutes with the patient via telehealth technology discussing the above  problems/concerns.    Jonathan Simas, FNP

## 2022-04-04 ENCOUNTER — Encounter (HOSPITAL_COMMUNITY): Payer: Self-pay

## 2022-04-04 ENCOUNTER — Ambulatory Visit (HOSPITAL_COMMUNITY)
Admission: RE | Admit: 2022-04-04 | Discharge: 2022-04-04 | Disposition: A | Discharge status: Home / Self Care | Payer: 59 | Source: Ambulatory Visit | Attending: Nurse Practitioner | Admitting: Nurse Practitioner

## 2022-04-04 VITALS — BP 154/81 | HR 68 | Temp 98.5°F | Resp 17

## 2022-04-04 DIAGNOSIS — N489 Disorder of penis, unspecified: Secondary | ICD-10-CM | POA: Insufficient documentation

## 2022-04-04 DIAGNOSIS — Z113 Encounter for screening for infections with a predominantly sexual mode of transmission: Secondary | ICD-10-CM | POA: Insufficient documentation

## 2022-04-04 LAB — HIV ANTIBODY (ROUTINE TESTING W REFLEX): HIV Screen 4th Generation wRfx: NONREACTIVE

## 2022-04-04 MED ORDER — VALACYCLOVIR HCL 1 G PO TABS: 1000.0000 mg | ORAL_TABLET | Freq: Two times a day (BID) | ORAL | 0 refills | Status: AC

## 2022-04-04 NOTE — ED Provider Notes (Signed)
?[facility] ? ? ? ?CSN: [id number] ?Arrival date & time: 04/04/22  1625 ? ? ?  ? ?History   ?Chief Complaint ?Chief Complaint  ?Patient presents with  ? Exposure to STD  ? ? ?HPI ?Jonathan Giles is a 26 y.o. male.  ? ?Patient presents today for "bumps" on his penis that popped up a couple of days ago.  He reports the bumps are not painful, they do not sting or itch.  He says they are fluid-filled and looks like a clear fluid.  He has never had these type of bumps before.  He denies fevers, nausea/vomiting, any other sores or rashes, swelling in his groin, and penile discharge.  Denies known exposure to STI.  He is currently sexually active with 1 partner. ? ? ?History reviewed. No pertinent past medical history. ? ?There are no problems to display for this patient. ? ? ?History reviewed. No pertinent surgical history. ? ? ? ? ?Home Medications   ? ?Prior to Admission medications   ?Medication Sig Start Date End Date Taking? Authorizing Provider  ?valACYclovir (VALTREX) 1000 MG tablet Take 1 tablet (1,000 mg total) by mouth 2 (two) times daily for 10 days. 04/04/22 04/14/22 Yes Eulogio Bear, NP  ? ? ?Family History ?Family History  ?Problem Relation Age of Onset  ? Diabetes Mother   ? Cancer Paternal Grandfather   ? ? ?Social History ?Social History  ? ?Tobacco Use  ? Smoking status: Never  ? Smokeless tobacco: Never  ?Vaping Use  ? Vaping Use: Never used  ?Substance Use Topics  ? Alcohol use: Yes  ?  Comment: occassional  ? Drug use: No  ? ? ? ?Allergies   ?Patient has no known allergies. ? ? ?Review of Systems ?Review of Systems ?Per HPI ? ?Physical Exam ?Triage Vital Signs ?ED Triage Vitals [04/04/22 1649]  ?Enc Vitals Group  ?   BP (!) 154/81  ?   Pulse Rate 68  ?   Resp 17  ?   Temp 98.5 ?F (36.9 ?C)  ?   Temp Source Oral  ?   SpO2 98 %  ?   Weight   ?   Height   ?   Head Circumference   ?   Peak Flow   ?   Pain Score 0  ?   Pain Loc   ?   Pain Edu?   ?   Excl. in [place]?   ? ?No data found. ? ?Updated  Vital Signs ?BP (!) 154/81 (BP Location: Right Arm)   Pulse 68   Temp 98.5 ?F (36.9 ?C) (Oral)   Resp 17   SpO2 98%  ? ?Visual Acuity ?Right Eye Distance:   ?Left Eye Distance:   ?Bilateral Distance:   ? ?Right Eye Near:   ?Left Eye Near:    ?Bilateral Near:    ? ?Physical Exam ?Vitals and nursing note reviewed. Exam conducted with a chaperone present ([place], CMA).  ?Constitutional:   ?   General: He is not in acute distress. ?   Appearance: Normal appearance. He is not toxic-appearing.  ?Pulmonary:  ?   Effort: Pulmonary effort is normal. No respiratory distress.  ?Genitourinary: ?   Penis: Circumcised. Lesions present. No erythema, tenderness, discharge or swelling.   ?   Comments: Multiple tiny fluid-filled lesions to shaft of penis  ?Lymphadenopathy:  ?   Lower Body: No right inguinal adenopathy. No left inguinal adenopathy.  ?Skin: ?   General: Skin is  warm and dry.  ?   Capillary Refill: Capillary refill takes less than 2 seconds.  ?   Coloration: Skin is not jaundiced or pale.  ?Neurological:  ?   Mental Status: He is alert and oriented to person, place, and time.  ?   Motor: No weakness.  ?   Gait: Gait normal.  ?Psychiatric:     ?   Behavior: Behavior is cooperative.  ? ? ? ?UC Treatments / Results  ?Labs ?(all labs ordered are listed, but only abnormal results are displayed) ?Labs Reviewed  ?HSV CULTURE AND TYPING  ?RPR  ?HIV ANTIBODY (ROUTINE TESTING W REFLEX)  ?CYTOLOGY, (ORAL, ANAL, URETHRAL) ANCILLARY ONLY  ? ? ?EKG ? ? ?Radiology ?No results found. ? ?Procedures ?Procedures (including critical care time) ? ?Medications Ordered in UC ?Medications - No data to display ? ?Initial Impression / Assessment and Plan / UC Course  ?I have reviewed the triage vital signs and the nursing notes. ? ?Pertinent labs & imaging results that were available during my care of the patient were reviewed by me and considered in my medical decision making (see chart for details). ? ?  ?HSV culture obtained of  lesions today; I am highly suspicious for HSV.  We will treat empirically with Valtrex 1 g twice daily for 10 days.  We will test self swab for gonorrhea, chlamydia, trichomonas.  Also check HIV and syphilis.  Treat as indicated.  Encouraged use of condoms with every sexual encounter. ?Final Clinical Impressions(s) / UC Diagnoses  ? ?Final diagnoses:  ?Routine screening for STI (sexually transmitted infection)  ?Penile lesion  ? ? ? ?Discharge Instructions   ? ?  ?- Your examination is consistent with herpes virus.  Please start Valtrex 1000 mg twice daily for 10 days to help clear this up.  Please refrain from sexual activity until this is healed. ?-We will let you know with any positive testing within the next few days. ?-Please wear condoms with every sexual encounter to prevent spread of STI ? ? ? ?ED Prescriptions   ? ? Medication Sig Dispense Auth. Provider  ? valACYclovir (VALTREX) 1000 MG tablet Take 1 tablet (1,000 mg total) by mouth 2 (two) times daily for 10 days. 20 tablet Eulogio Bear, NP  ? ?  ? ?PDMP not reviewed this encounter. ?  ?Eulogio Bear, NP ?04/04/22 1730 ? ?

## 2022-04-04 NOTE — Discharge Instructions (Addendum)
-   Your examination is consistent with herpes virus.  Please start Valtrex 1000 mg twice daily for 10 days to help clear this up.  Please refrain from sexual activity until this is healed. ?-We will let you know with any positive testing within the next few days. ?-Please wear condoms with every sexual encounter to prevent spread of STI ?

## 2022-04-04 NOTE — ED Triage Notes (Signed)
Pt has fluid filled pimple like bumps on penis for 3 days. Reports one has popped.  ?

## 2022-04-05 LAB — CYTOLOGY, (ORAL, ANAL, URETHRAL) ANCILLARY ONLY
Chlamydia: NEGATIVE
Comment: NEGATIVE
Comment: NEGATIVE
Comment: NORMAL
Neisseria Gonorrhea: NEGATIVE
Trichomonas: NEGATIVE

## 2022-04-05 LAB — RPR: RPR Ser Ql: NONREACTIVE

## 2022-04-07 LAB — HSV CULTURE AND TYPING

## 2022-04-08 NOTE — Progress Notes (Signed)
Pt notified of positive test results. Pt was prescribed medication on day of visit. Pt education to complete medication. All questions answered.

## 2022-08-13 ENCOUNTER — Telehealth: Payer: 59 | Admitting: Physician Assistant

## 2022-08-13 DIAGNOSIS — A6001 Herpesviral infection of penis: Secondary | ICD-10-CM | POA: Diagnosis not present

## 2022-08-13 MED ORDER — VALACYCLOVIR HCL 500 MG PO TABS: 500.0000 mg | ORAL_TABLET | Freq: Two times a day (BID) | ORAL | 0 refills | Status: AC

## 2022-08-13 NOTE — Progress Notes (Signed)
E-Visit for Herpes Simplex  We are sorry that you are not feeling well.  Here is how we plan to help!  Based on what you have shared ith me, it looks like you may be having an outbreak/flare-up of genital herpes.    I have prescribed I have prescribed Valacyclovir 500 mg Take one by mouth twice a day for 3 days.    If you have been prescribed long term medications to be taken on a regular basis, it is important to follow the recommendations and take them as ordered.    Outbreaks usually include blisters and open sores in the genital area. Outbreaks that happen after the first time are usually not as severe and do not last as long. Genital Herpes Simplex is a commonly sexually transmitted viral infection that is found worldwide. Most of these genital infections are caused by one or two herpes simplex viruses that is passed from person to person during vaginal, oral, or anal sex. Sometimes, people do not know they have herpes because they do not have any symptoms.  Please be aware that if you have genital herpes you can be contagious even when you are not having rash or flare-up and you may not have any symptoms, even when you are taking suppressive medicines.  Herpes cannot be cured. The disease usually causes most problems during the first few years. After that, the virus is still there, but it causes few to no symptoms. Even when the virus is active, people with herpes can take medicines to reduce and help prevent symptoms.  Herpes is an infection that can cause blisters and open sores on the genital area. Herpes is caused by a virus that is passed from person to person during vaginal, oral, or anal sex. Sometimes, people do not know they have herpes because they do not have any symptoms. Herpes cannot be cured. The disease usually causes most problems during the first few years. After that, the virus is still there, but it causes few to no symptoms. Even when the virus is active, people with herpes  can take medicines to reduce and help prevent symptoms.  If you have been prescribed medications to be taken on a regular basis, it is important to follow the recommendations and take them as ordered.  Some people with herpes never have any symptoms. But other people can develop symptoms within a few weeks of being infected with the herpes virus   Symptoms usually include blisters in the genital area. In women, this area includes the vagina, buttocks, anus, or thighs. In men, this area includes the penis, scrotum, anus, butt, or thighs. The blisters can become painful open sores, which then crust over as they heal. Sometimes, people can have other symptoms that include:  ?Blisters on the mouth or lips ?Fever, headache, or pain in the joints ?Trouble urinating  Outbreaks might occur every month or more often, or just once or twice a year. Sometimes, people can tell when an outbreak will occur, because they feel itching or pain beforehand. Sometimes they do not know that an outbreak is coming because they have no symptoms. Whatever your pattern is, keep in mind that herpes outbreaks usually become less frequent over time as you get older. Certain things, called "triggers," can make outbreaks more likely to occur. These include stress, sunlight, menstrual periods,or getting sick.  Antiviral therapy can shorten the duration of symptoms and signs in primary infection, which, when untreated, can be associated with significant increase in the   symptoms of the disease.  HOME CARE Use a portable bath (such as a "Sitz bath") where you can sit in warm water for about 20 minutes. Your bathtub could also work. Avoid bubble baths.  Keep the genital area clean and dry and avoid tight clothes.  Take over-the-counter pain medicine such as acetaminophen (brand name: Tylenol) or ibuprofen sample brand names: Advil, Motrin). But avoid aspirin.  Only take medications as instructed by your medical team.  You are  most likely to spread herpes to a sex partner when you have blisters and open sores on your body. But it's also possible to spread herpes to your partner when you do not have any symptoms. That is because herpes can be present on your body without causing any symptoms, like blisters or pain.  Telling your sex partner that you have herpes can be hard. But it can help protect them, since there are ways to lower the risk of spreading the infection.   Using a condom every time you have sex  Not having sex when you have symptoms  Not having oral sex if you have blisters or open sores (in the genital area or around your mouth)  MAKE SURE YOU   Understand these instructions. Do not have sex without using a condom until you have been seen by a doctor and as instructed by the provider If you are not better or improved within 7 days, you MUST have a follow up at your doctor or the health department for evaluation. There are other causes of rashes in the genital region.  Thank you for choosing an e-visit.  Your e-visit answers were reviewed by a board certified advanced clinical practitioner to complete your personal care plan. Depending upon the condition, your plan could have included both over the counter or prescription medications.  Please review your pharmacy choice. Make sure the pharmacy is open so you can pick up prescription now. If there is a problem, you may contact your provider through MyChart messaging and have the prescription routed to another pharmacy.  Your safety is important to us. If you have drug allergies check your prescription carefully.   For the next 24 hours you can use MyChart to ask questions about today's visit, request a non-urgent call back, or ask for a work or school excuse. You will get an email in the next two days asking about your experience. I hope that your e-visit has been valuable and will speed your recovery.      

## 2022-08-13 NOTE — Progress Notes (Signed)
I have spent 5 minutes in review of e-visit questionnaire, review and updating patient chart, medical decision making and response to patient.   Carmalita Wakefield Cody Amarylis Rovito, PA-C    

## 2022-10-16 ENCOUNTER — Telehealth: Payer: 59 | Admitting: Physician Assistant

## 2022-10-16 DIAGNOSIS — A6001 Herpesviral infection of penis: Secondary | ICD-10-CM

## 2022-10-16 MED ORDER — VALACYCLOVIR HCL 500 MG PO TABS: 500.0000 mg | ORAL_TABLET | Freq: Two times a day (BID) | ORAL | 0 refills | Status: AC

## 2022-10-16 NOTE — Progress Notes (Signed)
E-Visit for Herpes Simplex  We are sorry that you are not feeling well.  Here is how we plan to help!  Based on what you have shared ith me, it looks like you may be having an outbreak/flare-up of genital herpes.    I have prescribed I have prescribed Valacyclovir 500 mg Take one by mouth twice a day for 3 days.    If you have been prescribed long term medications to be taken on a regular basis, it is important to follow the recommendations and take them as ordered.    Outbreaks usually include blisters and open sores in the genital area. Outbreaks that happen after the first time are usually not as severe and do not last as long. Genital Herpes Simplex is a commonly sexually transmitted viral infection that is found worldwide. Most of these genital infections are caused by one or two herpes simplex viruses that is passed from person to person during vaginal, oral, or anal sex. Sometimes, people do not know they have herpes because they do not have any symptoms.  Please be aware that if you have genital herpes you can be contagious even when you are not having rash or flare-up and you may not have any symptoms, even when you are taking suppressive medicines.  Herpes cannot be cured. The disease usually causes most problems during the first few years. After that, the virus is still there, but it causes few to no symptoms. Even when the virus is active, people with herpes can take medicines to reduce and help prevent symptoms.  Herpes is an infection that can cause blisters and open sores on the genital area. Herpes is caused by a virus that is passed from person to person during vaginal, oral, or anal sex. Sometimes, people do not know they have herpes because they do not have any symptoms. Herpes cannot be cured. The disease usually causes most problems during the first few years. After that, the virus is still there, but it causes few to no symptoms. Even when the virus is active, people with herpes  can take medicines to reduce and help prevent symptoms.  If you have been prescribed medications to be taken on a regular basis, it is important to follow the recommendations and take them as ordered.  Some people with herpes never have any symptoms. But other people can develop symptoms within a few weeks of being infected with the herpes virus   Symptoms usually include blisters in the genital area. In women, this area includes the vagina, buttocks, anus, or thighs. In men, this area includes the penis, scrotum, anus, butt, or thighs. The blisters can become painful open sores, which then crust over as they heal. Sometimes, people can have other symptoms that include:  ?Blisters on the mouth or lips ?Fever, headache, or pain in the joints ?Trouble urinating  Outbreaks might occur every month or more often, or just once or twice a year. Sometimes, people can tell when an outbreak will occur, because they feel itching or pain beforehand. Sometimes they do not know that an outbreak is coming because they have no symptoms. Whatever your pattern is, keep in mind that herpes outbreaks usually become less frequent over time as you get older. Certain things, called "triggers," can make outbreaks more likely to occur. These include stress, sunlight, menstrual periods,or getting sick.  Antiviral therapy can shorten the duration of symptoms and signs in primary infection, which, when untreated, can be associated with significant increase in the   symptoms of the disease.  HOME CARE Use a portable bath (such as a "Sitz bath") where you can sit in warm water for about 20 minutes. Your bathtub could also work. Avoid bubble baths.  Keep the genital area clean and dry and avoid tight clothes.  Take over-the-counter pain medicine such as acetaminophen (brand name: Tylenol) or ibuprofen sample brand names: Advil, Motrin). But avoid aspirin.  Only take medications as instructed by your medical team.  You are  most likely to spread herpes to a sex partner when you have blisters and open sores on your body. But it's also possible to spread herpes to your partner when you do not have any symptoms. That is because herpes can be present on your body without causing any symptoms, like blisters or pain.  Telling your sex partner that you have herpes can be hard. But it can help protect them, since there are ways to lower the risk of spreading the infection.   Using a condom every time you have sex  Not having sex when you have symptoms  Not having oral sex if you have blisters or open sores (in the genital area or around your mouth)  MAKE SURE YOU   Understand these instructions. Do not have sex without using a condom until you have been seen by a doctor and as instructed by the provider If you are not better or improved within 7 days, you MUST have a follow up at your doctor or the health department for evaluation. There are other causes of rashes in the genital region.  Thank you for choosing an e-visit.  Your e-visit answers were reviewed by a board certified advanced clinical practitioner to complete your personal care plan. Depending upon the condition, your plan could have included both over the counter or prescription medications.  Please review your pharmacy choice. Make sure the pharmacy is open so you can pick up prescription now. If there is a problem, you may contact your provider through MyChart messaging and have the prescription routed to another pharmacy.  Your safety is important to us. If you have drug allergies check your prescription carefully.   For the next 24 hours you can use MyChart to ask questions about today's visit, request a non-urgent call back, or ask for a work or school excuse. You will get an email in the next two days asking about your experience. I hope that your e-visit has been valuable and will speed your recovery.   I have spent 5 minutes in review of e-visit  questionnaire, review and updating patient chart, medical decision making and response to patient.   Jenee Spaugh M Farhana Fellows, PA-C  

## 2022-12-26 ENCOUNTER — Telehealth: Payer: 59 | Admitting: Physician Assistant

## 2022-12-26 DIAGNOSIS — A6001 Herpesviral infection of penis: Secondary | ICD-10-CM | POA: Diagnosis not present

## 2022-12-26 MED ORDER — VALACYCLOVIR HCL 500 MG PO TABS: 500.0000 mg | ORAL_TABLET | Freq: Two times a day (BID) | ORAL | 0 refills | Status: AC

## 2022-12-26 NOTE — Progress Notes (Signed)
I have spent 5 minutes in review of e-visit questionnaire, review and updating patient chart, medical decision making and response to patient.   Toby Breithaupt Cody Martinez Boxx, PA-C    

## 2022-12-26 NOTE — Progress Notes (Signed)
E-Visit for Herpes Simplex  We are sorry that you are not feeling well.  Here is how we plan to help!  Based on what you have shared ith me, it looks like you may be having an outbreak/flare-up of genital herpes.    I have prescribed I have prescribed Valacyclovir 500 mg Take one by mouth twice a day for 3 days.    If you have been prescribed long term medications to be taken on a regular basis, it is important to follow the recommendations and take them as ordered.    Outbreaks usually include blisters and open sores in the genital area. Outbreaks that happen after the first time are usually not as severe and do not last as long. Genital Herpes Simplex is a commonly sexually transmitted viral infection that is found worldwide. Most of these genital infections are caused by one or two herpes simplex viruses that is passed from person to person during vaginal, oral, or anal sex. Sometimes, people do not know they have herpes because they do not have any symptoms.  Please be aware that if you have genital herpes you can be contagious even when you are not having rash or flare-up and you may not have any symptoms, even when you are taking suppressive medicines.  Herpes cannot be cured. The disease usually causes most problems during the first few years. After that, the virus is still there, but it causes few to no symptoms. Even when the virus is active, people with herpes can take medicines to reduce and help prevent symptoms.  Herpes is an infection that can cause blisters and open sores on the genital area. Herpes is caused by a virus that is passed from person to person during vaginal, oral, or anal sex. Sometimes, people do not know they have herpes because they do not have any symptoms. Herpes cannot be cured. The disease usually causes most problems during the first few years. After that, the virus is still there, but it causes few to no symptoms. Even when the virus is active, people with herpes  can take medicines to reduce and help prevent symptoms.  If you have been prescribed medications to be taken on a regular basis, it is important to follow the recommendations and take them as ordered.  Some people with herpes never have any symptoms. But other people can develop symptoms within a few weeks of being infected with the herpes virus   Symptoms usually include blisters in the genital area. In women, this area includes the vagina, buttocks, anus, or thighs. In men, this area includes the penis, scrotum, anus, butt, or thighs. The blisters can become painful open sores, which then crust over as they heal. Sometimes, people can have other symptoms that include:  ?Blisters on the mouth or lips ?Fever, headache, or pain in the joints ?Trouble urinating  Outbreaks might occur every month or more often, or just once or twice a year. Sometimes, people can tell when an outbreak will occur, because they feel itching or pain beforehand. Sometimes they do not know that an outbreak is coming because they have no symptoms. Whatever your pattern is, keep in mind that herpes outbreaks usually become less frequent over time as you get older. Certain things, called "triggers," can make outbreaks more likely to occur. These include stress, sunlight, menstrual periods,or getting sick.  Antiviral therapy can shorten the duration of symptoms and signs in primary infection, which, when untreated, can be associated with significant increase in the  symptoms of the disease.  HOME CARE Use a portable bath (such as a "Sitz bath") where you can sit in warm water for about 20 minutes. Your bathtub could also work. Avoid bubble baths.  Keep the genital area clean and dry and avoid tight clothes.  Take over-the-counter pain medicine such as acetaminophen (brand name: Tylenol) or ibuprofen sample brand names: Advil, Motrin). But avoid aspirin.  Only take medications as instructed by your medical team.  You are  most likely to spread herpes to a sex partner when you have blisters and open sores on your body. But it's also possible to spread herpes to your partner when you do not have any symptoms. That is because herpes can be present on your body without causing any symptoms, like blisters or pain.  Telling your sex partner that you have herpes can be hard. But it can help protect them, since there are ways to lower the risk of spreading the infection.   Using a condom every time you have sex  Not having sex when you have symptoms  Not having oral sex if you have blisters or open sores (in the genital area or around your mouth)  MAKE SURE YOU   Understand these instructions. Do not have sex without using a condom until you have been seen by a doctor and as instructed by the provider If you are not better or improved within 7 days, you MUST have a follow up at your doctor or the health department for evaluation. There are other causes of rashes in the genital region.  Thank you for choosing an e-visit.  Your e-visit answers were reviewed by a board certified advanced clinical practitioner to complete your personal care plan. Depending upon the condition, your plan could have included both over the counter or prescription medications.  Please review your pharmacy choice. Make sure the pharmacy is open so you can pick up prescription now. If there is a problem, you may contact your provider through [employer] and have the prescription routed to another pharmacy.  Your safety is important to [place]. If you have drug allergies check your prescription carefully.   For the next 24 hours you can use MyChart to ask questions about today's visit, request a non-urgent call back, or ask for a work or school excuse. You will get an email in the next two days asking about your experience. I hope that your e-visit has been valuable and will speed your recovery.

## 2023-02-20 ENCOUNTER — Telehealth: Payer: 59 | Admitting: Nurse Practitioner

## 2023-02-20 DIAGNOSIS — A6000 Herpesviral infection of urogenital system, unspecified: Secondary | ICD-10-CM

## 2023-02-20 MED ORDER — VALACYCLOVIR HCL 500 MG PO TABS: 500.0000 mg | ORAL_TABLET | Freq: Two times a day (BID) | ORAL | 0 refills | Status: AC

## 2023-02-20 NOTE — Progress Notes (Signed)
E-Visit for Herpes Simplex  We are sorry that you are not feeling well.  Here is how we plan to help!  Based on what you have shared ith me, it looks like you may be having an outbreak/flare-up of genital herpes.    I have prescribed I have prescribed Valacyclovir 500 mg Take one by mouth twice a day for 3 days.    If you have been prescribed long term medications to be taken on a regular basis, it is important to follow the recommendations and take them as ordered.    Outbreaks usually include blisters and open sores in the genital area. Outbreaks that happen after the first time are usually not as severe and do not last as long. Genital Herpes Simplex is a commonly sexually transmitted viral infection that is found worldwide. Most of these genital infections are caused by one or two herpes simplex viruses that is passed from person to person during vaginal, oral, or anal sex. Sometimes, people do not know they have herpes because they do not have any symptoms.  Please be aware that if you have genital herpes you can be contagious even when you are not having rash or flare-up and you may not have any symptoms, even when you are taking suppressive medicines.  Herpes cannot be cured. The disease usually causes most problems during the first few years. After that, the virus is still there, but it causes few to no symptoms. Even when the virus is active, people with herpes can take medicines to reduce and help prevent symptoms.  Herpes is an infection that can cause blisters and open sores on the genital area. Herpes is caused by a virus that is passed from person to person during vaginal, oral, or anal sex. Sometimes, people do not know they have herpes because they do not have any symptoms. Herpes cannot be cured. The disease usually causes most problems during the first few years. After that, the virus is still there, but it causes few to no symptoms. Even when the virus is active, people with herpes  can take medicines to reduce and help prevent symptoms.  If you have been prescribed medications to be taken on a regular basis, it is important to follow the recommendations and take them as ordered.  Some people with herpes never have any symptoms. But other people can develop symptoms within a few weeks of being infected with the herpes virus   Symptoms usually include blisters in the genital area. In women, this area includes the vagina, buttocks, anus, or thighs. In men, this area includes the penis, scrotum, anus, butt, or thighs. The blisters can become painful open sores, which then crust over as they heal. Sometimes, people can have other symptoms that include:  ?Blisters on the mouth or lips ?Fever, headache, or pain in the joints ?Trouble urinating  Outbreaks might occur every month or more often, or just once or twice a year. Sometimes, people can tell when an outbreak will occur, because they feel itching or pain beforehand. Sometimes they do not know that an outbreak is coming because they have no symptoms. Whatever your pattern is, keep in mind that herpes outbreaks usually become less frequent over time as you get older. Certain things, called "triggers," can make outbreaks more likely to occur. These include stress, sunlight, menstrual periods,or getting sick.  Antiviral therapy can shorten the duration of symptoms and signs in primary infection, which, when untreated, can be associated with significant increase in the  symptoms of the disease.  HOME CARE Use a portable bath (such as a "Sitz bath") where you can sit in warm water for about 20 minutes. Your bathtub could also work. Avoid bubble baths.  Keep the genital area clean and dry and avoid tight clothes.  Take over-the-counter pain medicine such as acetaminophen (brand name: Tylenol) or ibuprofen sample brand names: Advil, Motrin). But avoid aspirin.  Only take medications as instructed by your medical team.  You are  most likely to spread herpes to a sex partner when you have blisters and open sores on your body. But it's also possible to spread herpes to your partner when you do not have any symptoms. That is because herpes can be present on your body without causing any symptoms, like blisters or pain.  Telling your sex partner that you have herpes can be hard. But it can help protect them, since there are ways to lower the risk of spreading the infection.   Using a condom every time you have sex  Not having sex when you have symptoms  Not having oral sex if you have blisters or open sores (in the genital area or around your mouth)  MAKE SURE YOU   Understand these instructions. Do not have sex without using a condom until you have been seen by a doctor and as instructed by the provider If you are not better or improved within 7 days, you MUST have a follow up at your doctor or the health department for evaluation. There are other causes of rashes in the genital region.  Thank you for choosing an e-visit.  Your e-visit answers were reviewed by a board certified advanced clinical practitioner to complete your personal care plan. Depending upon the condition, your plan could have included both over the counter or prescription medications.  Please review your pharmacy choice. Make sure the pharmacy is open so you can pick up prescription now. If there is a problem, you may contact your provider through [employer] and have the prescription routed to another pharmacy.  Your safety is important to [place]. If you have drug allergies check your prescription carefully.   For the next 24 hours you can use MyChart to ask questions about today's visit, request a non-urgent call back, or ask for a work or school excuse. You will get an email in the next two days asking about your experience. I hope that your e-visit has been valuable and will speed your recovery.  Meds ordered this encounter  Medications    valACYclovir (VALTREX) 500 MG tablet    Sig: Take 1 tablet (500 mg total) by mouth 2 (two) times daily for 3 days.    Dispense:  6 tablet    Refill:  0    I spent approximately 5 minutes reviewing the patient's history, current symptoms and coordinating their care today.

## 2023-03-13 ENCOUNTER — Telehealth: Payer: Self-pay | Admitting: Urgent Care

## 2023-03-13 DIAGNOSIS — B009 Herpesviral infection, unspecified: Secondary | ICD-10-CM

## 2023-03-13 MED ORDER — VALACYCLOVIR HCL 500 MG PO TABS
500.0000 mg | ORAL_TABLET | Freq: Two times a day (BID) | ORAL | 2 refills | Status: AC
Start: 2023-03-13 — End: 2023-03-16

## 2023-03-13 NOTE — Progress Notes (Signed)
E-Visit for Herpes Simplex  We are sorry that you are not feeling well.  Here is how we plan to help!  Based on what you have shared ith me, it looks like you may be having an outbreak/flare-up of genital herpes.    I have prescribed I have prescribed Valacyclovir 500 mg Take one by mouth twice a day for 3 days.    If you have been prescribed long term medications to be taken on a regular basis, it is important to follow the recommendations and take them as ordered.    Outbreaks usually include blisters and open sores in the genital area. Outbreaks that happen after the first time are usually not as severe and do not last as long. Genital Herpes Simplex is a commonly sexually transmitted viral infection that is found worldwide. Most of these genital infections are caused by one or two herpes simplex viruses that is passed from person to person during vaginal, oral, or anal sex. Sometimes, people do not know they have herpes because they do not have any symptoms.  Please be aware that if you have genital herpes you can be contagious even when you are not having rash or flare-up and you may not have any symptoms, even when you are taking suppressive medicines.  Herpes cannot be cured. The disease usually causes most problems during the first few years. After that, the virus is still there, but it causes few to no symptoms. Even when the virus is active, people with herpes can take medicines to reduce and help prevent symptoms.  Herpes is an infection that can cause blisters and open sores on the genital area. Herpes is caused by a virus that is passed from person to person during vaginal, oral, or anal sex. Sometimes, people do not know they have herpes because they do not have any symptoms. Herpes cannot be cured. The disease usually causes most problems during the first few years. After that, the virus is still there, but it causes few to no symptoms. Even when the virus is active, people with herpes  can take medicines to reduce and help prevent symptoms.  If you have been prescribed medications to be taken on a regular basis, it is important to follow the recommendations and take them as ordered.  Some people with herpes never have any symptoms. But other people can develop symptoms within a few weeks of being infected with the herpes virus   Symptoms usually include blisters in the genital area. In women, this area includes the vagina, buttocks, anus, or thighs. In men, this area includes the penis, scrotum, anus, butt, or thighs. The blisters can become painful open sores, which then crust over as they heal. Sometimes, people can have other symptoms that include:  ?Blisters on the mouth or lips ?Fever, headache, or pain in the joints ?Trouble urinating  Outbreaks might occur every month or more often, or just once or twice a year. Sometimes, people can tell when an outbreak will occur, because they feel itching or pain beforehand. Sometimes they do not know that an outbreak is coming because they have no symptoms. Whatever your pattern is, keep in mind that herpes outbreaks usually become less frequent over time as you get older. Certain things, called "triggers," can make outbreaks more likely to occur. These include stress, sunlight, menstrual periods,or getting sick.  Antiviral therapy can shorten the duration of symptoms and signs in primary infection, which, when untreated, can be associated with significant increase in the  symptoms of the disease.  HOME CARE Use a portable bath (such as a "Sitz bath") where you can sit in warm water for about 20 minutes. Your bathtub could also work. Avoid bubble baths.  Keep the genital area clean and dry and avoid tight clothes.  Take over-the-counter pain medicine such as acetaminophen (brand name: Tylenol) or ibuprofen sample brand names: Advil, Motrin). But avoid aspirin.  Only take medications as instructed by your medical team.  You are  most likely to spread herpes to a sex partner when you have blisters and open sores on your body. But it's also possible to spread herpes to your partner when you do not have any symptoms. That is because herpes can be present on your body without causing any symptoms, like blisters or pain.  Telling your sex partner that you have herpes can be hard. But it can help protect them, since there are ways to lower the risk of spreading the infection.   Using a condom every time you have sex  Not having sex when you have symptoms  Not having oral sex if you have blisters or open sores (in the genital area or around your mouth)  MAKE SURE YOU   Understand these instructions. Do not have sex without using a condom until you have been seen by a doctor and as instructed by the provider If you are not better or improved within 7 days, you MUST have a follow up at your doctor or the health department for evaluation. There are other causes of rashes in the genital region.  Thank you for choosing an e-visit.  Your e-visit answers were reviewed by a board certified advanced clinical practitioner to complete your personal care plan. Depending upon the condition, your plan could have included both over the counter or prescription medications.  Please review your pharmacy choice. Make sure the pharmacy is open so you can pick up prescription now. If there is a problem, you may contact your provider through [employer] and have the prescription routed to another pharmacy.  Your safety is important to [place]. If you have drug allergies check your prescription carefully.   For the next 24 hours you can use MyChart to ask questions about today's visit, request a non-urgent call back, or ask for a work or school excuse. You will get an email in the next two days asking about your experience. I hope that your e-visit has been valuable and will speed your recovery.     I have spent 5 minutes in review of e-visit  questionnaire, review and updating patient chart, medical decision making and response to patient.   [place], PA

## 2023-03-28 ENCOUNTER — Telehealth: Payer: 59 | Admitting: Nurse Practitioner

## 2023-03-28 DIAGNOSIS — A6001 Herpesviral infection of penis: Secondary | ICD-10-CM | POA: Diagnosis not present

## 2023-03-29 MED ORDER — VALACYCLOVIR HCL 500 MG PO TABS
500.0000 mg | ORAL_TABLET | Freq: Two times a day (BID) | ORAL | 0 refills | Status: AC
Start: 2023-03-29 — End: 2023-04-01

## 2023-03-29 NOTE — Progress Notes (Signed)
Please follow up with Jonathan Giles for suppressive therapy as soon as possible. You can send her a mychart message and she will let you know if you need an appt or if she can send the script for suppressive therapy.  E-Visit for Herpes Simplex  We are sorry that you are not feeling well.  Here is how we plan to help!  Based on what you have shared ith me, it looks like you may be having an outbreak/flare-up of genital herpes.    I have prescribed I have prescribed Valacyclovir 500 mg Take one by mouth twice a day for 3 days.    If you have been prescribed long term medications to be taken on a regular basis, it is important to follow the recommendations and take them as ordered.    Outbreaks usually include blisters and open sores in the genital area. Outbreaks that happen after the first time are usually not as severe and do not last as long. Genital Herpes Simplex is a commonly sexually transmitted viral infection that is found worldwide. Most of these genital infections are caused by one or two herpes simplex viruses that is passed from person to person during vaginal, oral, or anal sex. Sometimes, people do not know they have herpes because they do not have any symptoms.  Please be aware that if you have genital herpes you can be contagious even when you are not having rash or flare-up and you may not have any symptoms, even when you are taking suppressive medicines.  Herpes cannot be cured. The disease usually causes most problems during the first few years. After that, the virus is still there, but it causes few to no symptoms. Even when the virus is active, people with herpes can take medicines to reduce and help prevent symptoms.  Herpes is an infection that can cause blisters and open sores on the genital area. Herpes is caused by a virus that is passed from person to person during vaginal, oral, or anal sex. Sometimes, people do not know they have herpes because they do not have any  symptoms. Herpes cannot be cured. The disease usually causes most problems during the first few years. After that, the virus is still there, but it causes few to no symptoms. Even when the virus is active, people with herpes can take medicines to reduce and help prevent symptoms.  If you have been prescribed medications to be taken on a regular basis, it is important to follow the recommendations and take them as ordered.  Some people with herpes never have any symptoms. But other people can develop symptoms within a few weeks of being infected with the herpes virus   Symptoms usually include blisters in the genital area. In women, this area includes the vagina, buttocks, anus, or thighs. In men, this area includes the penis, scrotum, anus, butt, or thighs. The blisters can become painful open sores, which then crust over as they heal. Sometimes, people can have other symptoms that include:  ?Blisters on the mouth or lips ?Fever, headache, or pain in the joints ?Trouble urinating  Outbreaks might occur every month or more often, or just once or twice a year. Sometimes, people can tell when an outbreak will occur, because they feel itching or pain beforehand. Sometimes they do not know that an outbreak is coming because they have no symptoms. Whatever your pattern is, keep in mind that herpes outbreaks usually become less frequent over time as you get older. Certain things,  called "triggers," can make outbreaks more likely to occur. These include stress, sunlight, menstrual periods,or getting sick.  Antiviral therapy can shorten the duration of symptoms and signs in primary infection, which, when untreated, can be associated with significant increase in the symptoms of the disease.  HOME CARE Use a portable bath (such as a "Sitz bath") where you can sit in warm water for about 20 minutes. Your bathtub could also work. Avoid bubble baths.  Keep the genital area clean and dry and avoid tight  clothes.  Take over-the-counter pain medicine such as acetaminophen (brand name: Tylenol) or ibuprofen sample brand names: Advil, Motrin). But avoid aspirin.  Only take medications as instructed by your medical team.  You are most likely to spread herpes to a sex partner when you have blisters and open sores on your body. But it's also possible to spread herpes to your partner when you do not have any symptoms. That is because herpes can be present on your body without causing any symptoms, like blisters or pain.  Telling your sex partner that you have herpes can be hard. But it can help protect them, since there are ways to lower the risk of spreading the infection.   Using a condom every time you have sex  Not having sex when you have symptoms  Not having oral sex if you have blisters or open sores (in the genital area or around your mouth)  MAKE SURE YOU   Understand these instructions. Do not have sex without using a condom until you have been seen by a doctor and as instructed by the provider If you are not better or improved within 7 days, you MUST have a follow up at your doctor or the health department for evaluation. There are other causes of rashes in the genital region.  Thank you for choosing an e-visit.  Your e-visit answers were reviewed by a board certified advanced clinical practitioner to complete your personal care plan. Depending upon the condition, your plan could have included both over the counter or prescription medications.  Please review your pharmacy choice. Make sure the pharmacy is open so you can pick up prescription now. If there is a problem, you may contact your provider through [employer] and have the prescription routed to another pharmacy.  Your safety is important to [place]. If you have drug allergies check your prescription carefully.   For the next 24 hours you can use MyChart to ask questions about today's visit, request a non-urgent call back, or  ask for a work or school excuse. You will get an email in the next two days asking about your experience. I hope that your e-visit has been valuable and will speed your recovery.

## 2023-03-29 NOTE — Progress Notes (Signed)
I have spent 5 minutes in review of e-visit questionnaire, review and updating patient chart, medical decision making and response to patient.  ° °Selda Jalbert W Darryn Kydd, NP ° °  °

## 2023-04-15 ENCOUNTER — Telehealth: Payer: 59 | Admitting: Physician Assistant

## 2023-04-15 DIAGNOSIS — A6000 Herpesviral infection of urogenital system, unspecified: Secondary | ICD-10-CM

## 2023-04-15 NOTE — Progress Notes (Signed)
Because of frequency of outbreak recently with 4 visits in the past 6 weeks, I feel your condition warrants further evaluation and I recommend that you be seen in a face to face visit.   NOTE: There will be NO CHARGE for this eVisit   If you are having a true medical emergency please call 911.      For an urgent face to face visit, [facility] has eight urgent care centers for your convenience:   NEW!! [facility] at [facility] Get Driving Directions [phone] [address] [place], [address]    [facility] Health Urgent Care Center at [facility] Get Driving Directions [phone] [address] [place], [place] [address]   [facility] Health Urgent Care Center [facility]) Get Driving Directions [phone] [address] [place], [place] [address]  [facility] Health Urgent Care Center [facility]) Get Driving Directions [phone] [address] [place],  [place]  [address]  [facility] Health Urgent Care Center [facility] - at [employer]  [phone] [address] [place],  [place] [address]   [facility] Health Urgent Care at [facility] Get Driving Directions [phone] [address] [place], [place] [address]   [facility] Health Urgent Care at [facility] Get Driving Directions  [phone] [address] [place], [place] [address]   [facility] Health Urgent Care at [facility] Directions [phone] [address] [place], [place] [address]  Your MyChart E-visit questionnaire answers were reviewed by a board certified advanced clinical practitioner to complete your personal care plan based on your specific symptoms.  Thank you for using e-Visits.

## 2023-04-16 ENCOUNTER — Ambulatory Visit
Admission: EM | Admit: 2023-04-16 | Discharge: 2023-04-16 | Disposition: A | Discharge status: Home / Self Care | Payer: 59 | Attending: Nurse Practitioner | Admitting: Nurse Practitioner

## 2023-04-16 DIAGNOSIS — A6000 Herpesviral infection of urogenital system, unspecified: Secondary | ICD-10-CM

## 2023-04-16 MED ORDER — VALACYCLOVIR HCL 500 MG PO TABS: 500.0000 mg | ORAL_TABLET | Freq: Two times a day (BID) | ORAL | 1 refills | Status: AC

## 2023-04-16 NOTE — Discharge Instructions (Signed)
Please follow-up with the PCP for suppressive therapy treatment options Please go to emergency room for any worsening symptoms

## 2023-04-16 NOTE — ED Triage Notes (Signed)
Pt presents to UC stating he would like treatment for HSV. Pt states he has had lesions on and off for 2 years and has taken the 3 day treatment. He would like to have the long term treatment option after this current breakout is treated.

## 2023-04-16 NOTE — ED Provider Notes (Signed)
[facility]    CSN: [id number] Arrival date & time: 04/16/23  1553      History   Chief Complaint Chief Complaint  Patient presents with   SEXUALLY TRANSMITTED DISEASE    Entered by patient    HPI Jonathan Giles is a 27 y.o. male presents for evaluation of genital herpes outbreak.  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  He was most recently treated on 4/19 with 3 days of valacyclovir with resolution of symptoms.  He states his current symptoms began 2 to 3 days ago.  Positive HSV PCR on April 04, 2022.  He was told by virtual visit that he should be seen given his recurrent outbreaks for possible suppressive therapy.  He states he has a PCP but has not seen them in at least 2 years.  No dysuria, fevers, testicular pain or swelling, or penile discharge.  No other concerns at this time.  HPI  No past medical history on file.  There are no problems to display for this patient.   No past surgical history on file.     Home Medications    Prior to Admission medications   Medication Sig Start Date End Date Taking? Authorizing Provider  valACYclovir (VALTREX) 500 MG tablet Take 1 tablet (500 mg total) by mouth 2 (two) times daily for 3 days. 04/16/23 04/19/23 Yes Radford Pax, NP    Family History Family History  Problem Relation Age of Onset   Diabetes Mother    Cancer Paternal Grandfather     Social History Social History   Tobacco Use   Smoking status: Never   Smokeless tobacco: Never  Vaping Use   Vaping Use: Never used  Substance Use Topics   Alcohol use: Yes    Comment: occassional   Drug use: No     Allergies   Patient has no known allergies.   Review of Systems Review of Systems  Genitourinary:  Positive for genital sores.     Physical Exam Triage Vital Signs ED Triage Vitals  Enc Vitals Group     BP 04/16/23 1633 132/80     Pulse Rate 04/16/23 1633 60     Resp 04/16/23 1633 18     Temp  04/16/23 1633 98.6 F (37 C)     Temp Source 04/16/23 1633 Oral     SpO2 04/16/23 1633 97 %     Weight --      Height --      Head Circumference --      Peak Flow --      Pain Score 04/16/23 1635 1     Pain Loc --      Pain Edu? --      Excl. in GC? --    No data found.  Updated Vital Signs BP 132/80 (BP Location: Left Arm)   Pulse 60   Temp 98.6 F (37 C) (Oral)   Resp 18   SpO2 97%   Visual Acuity Right Eye Distance:   Left Eye Distance:   Bilateral Distance:    Right Eye Near:   Left Eye Near:    Bilateral Near:     Physical Exam Vitals and nursing note reviewed.  Constitutional:      General: He is not in acute distress.    Appearance: Normal appearance. He is not ill-appearing.  HENT:     Head: Normocephalic and atraumatic.  Eyes:     Pupils:  Pupils are equal, round, and reactive to light.  Cardiovascular:     Rate and Rhythm: Normal rate.  Pulmonary:     Effort: Pulmonary effort is normal.  Skin:    General: Skin is warm and dry.  Neurological:     General: No focal deficit present.     Mental Status: He is alert and oriented to person, place, and time.  Psychiatric:        Mood and Affect: Mood normal.        Behavior: Behavior normal.      UC Treatments / Results  Labs (all labs ordered are listed, but only abnormal results are displayed) Labs Reviewed - No data to display  Hsv Culture And Typing Order: [id number] Status: Edited Result - FINAL     Visible to patient: Yes (seen)     Next appt: None   Specimen Information: Penile; Other  1 Result Note     1 Patient Communication    Component 1 yr ago  HSV Culture/Type Comment Abnormal   Comment: (NOTE) Positive for Herpes simplex virus type-2. Typing was confirmed by monoclonal antibody microscopic immunofluorescence. Performed At: [facility] [address] [place], [place] [id number] Jolene Schimke MD HY:[id number]  Source of Sample PENILE VTM [place]  Comment: Performed at  [facility], [address] [place], [place] [address]  Resulting Agency [facility]             EKG   Radiology No results found.  Procedures Procedures (including critical care time)  Medications Ordered in UC Medications - No data to display  Initial Impression / Assessment and Plan / UC Course  I have reviewed the triage vital signs and the nursing notes.  Pertinent labs & imaging results that were available during my care of the patient were reviewed by me and considered in my medical decision making (see chart for details).     Reviewed recent labs as well as recent visits from telehealth Discussed with patient he will need a PCP for suppressive therapy Will treat current outbreak with valacyclovir as this has helped him before.  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 Provider   valACYclovir (VALTREX) 500 MG tablet Take 1 tablet (500 mg total) by mouth 2 (two) times daily for 3 days. 6 tablet Radford Pax, NP      PDMP not reviewed this encounter.   Radford Pax, NP 04/16/23 [phone]

## 2023-09-02 ENCOUNTER — Ambulatory Visit
Admission: EM | Admit: 2023-09-02 | Discharge: 2023-09-02 | Disposition: A | Discharge status: Home / Self Care | Payer: 59 | Attending: Internal Medicine | Admitting: Internal Medicine

## 2023-09-02 DIAGNOSIS — B009 Herpesviral infection, unspecified: Secondary | ICD-10-CM | POA: Diagnosis not present

## 2023-09-02 MED ORDER — VALACYCLOVIR HCL 1 G PO TABS: ORAL_TABLET | ORAL | 5 refills | Status: AC

## 2023-09-02 NOTE — ED Triage Notes (Signed)
Pt requested Valtrex refill. Pt has an ppt with new PCP on October.

## 2023-09-02 NOTE — ED Provider Notes (Signed)
[facility]  Note:  This document was prepared using Conservation officer, historic buildings and may include unintentional dictation errors.  MRN: [id number] DOB: 06-May-1996  Subjective:   Jonathan Giles is a 27 y.o. male presenting for medication refill for valacyclovir.  Patient has history of genital HSV.  Recently had an outbreak and needs a refill of the valacyclovir.  Otherwise he is well-controlled, does not have about 1 or 2 episodes yearly.  No current facility-administered medications for this encounter. No current outpatient medications on file.   No Known Allergies  History reviewed. No pertinent past medical history.   History reviewed. No pertinent surgical history.  Family History  Problem Relation Age of Onset   Diabetes Mother    Cancer Paternal Grandfather     Social History   Tobacco Use   Smoking status: Never   Smokeless tobacco: Never  Vaping Use   Vaping status: Never Used  Substance Use Topics   Alcohol use: Yes    Comment: occassional   Drug use: No    ROS   Objective:   Vitals: BP (!) 157/81 (BP Location: Right Arm)   Pulse 71   Temp 97.6 F (36.4 C) (Oral)   Resp 18   SpO2 96%   Physical Exam Constitutional:      General: He is not in acute distress.    Appearance: Normal appearance. He is well-developed and normal weight. He is not ill-appearing, toxic-appearing or diaphoretic.  HENT:     Head: Normocephalic and atraumatic.     Right Ear: External ear normal.     Left Ear: External ear normal.     Nose: Nose normal.     Mouth/Throat:     Pharynx: Oropharynx is clear.  Eyes:     General: No scleral icterus.       Right eye: No discharge.        Left eye: No discharge.     Extraocular Movements: Extraocular movements intact.  Cardiovascular:     Rate and Rhythm: Normal rate.  Pulmonary:     Effort: Pulmonary effort is normal.  Musculoskeletal:     Cervical back: Normal range of motion.  Neurological:      Mental Status: He is alert and oriented to person, place, and time.  Psychiatric:        Mood and Affect: Mood normal.        Behavior: Behavior normal.        Thought Content: Thought content normal.        Judgment: Judgment normal.     Assessment and Plan :   PDMP not reviewed this encounter.  1. Herpes simplex infection    Refills provided.  Follow-up with PCP.   Wallis Bamberg, [place] 09/02/23 1845
